# Patient Record
Sex: Female | Born: 1995 | Race: Black or African American | Hispanic: No | Marital: Single | Smoking: Never smoker
Health system: Southern US, Community
[De-identification: ages and names within clinical notes are randomized; demographics above are authoritative.]

## PROBLEM LIST (undated history)

## (undated) ENCOUNTER — Inpatient Hospital Stay (HOSPITAL_COMMUNITY): Payer: Self-pay

## (undated) DIAGNOSIS — O26619 Liver and biliary tract disorders in pregnancy, unspecified trimester: Secondary | ICD-10-CM

## (undated) DIAGNOSIS — K831 Obstruction of bile duct: Secondary | ICD-10-CM

## (undated) DIAGNOSIS — O26649 Intrahepatic cholestasis of pregnancy, unspecified trimester: Secondary | ICD-10-CM

## (undated) DIAGNOSIS — K8020 Calculus of gallbladder without cholecystitis without obstruction: Secondary | ICD-10-CM

## (undated) DIAGNOSIS — R87629 Unspecified abnormal cytological findings in specimens from vagina: Secondary | ICD-10-CM

## (undated) DIAGNOSIS — R011 Cardiac murmur, unspecified: Secondary | ICD-10-CM

## (undated) HISTORY — DX: Obstruction of bile duct: O26.619

## (undated) HISTORY — DX: Obstruction of bile duct: K83.1

## (undated) HISTORY — DX: Unspecified abnormal cytological findings in specimens from vagina: R87.629

## (undated) HISTORY — PX: INDUCED ABORTION: SHX677

## (undated) HISTORY — DX: Intrahepatic cholestasis of pregnancy, unspecified trimester: O26.649

---

## 2017-08-01 ENCOUNTER — Other Ambulatory Visit: Payer: Self-pay

## 2017-08-01 ENCOUNTER — Emergency Department (HOSPITAL_COMMUNITY)
Admission: EM | Admit: 2017-08-01 | Discharge: 2017-08-01 | Disposition: A | Discharge status: Home / Self Care | Payer: BLUE CROSS/BLUE SHIELD | Attending: Emergency Medicine | Admitting: Emergency Medicine

## 2017-08-01 ENCOUNTER — Encounter (HOSPITAL_COMMUNITY): Payer: Self-pay | Admitting: Emergency Medicine

## 2017-08-01 DIAGNOSIS — R21 Rash and other nonspecific skin eruption: Secondary | ICD-10-CM | POA: Insufficient documentation

## 2017-08-01 DIAGNOSIS — L509 Urticaria, unspecified: Secondary | ICD-10-CM | POA: Diagnosis present

## 2017-08-01 DIAGNOSIS — T7840XA Allergy, unspecified, initial encounter: Secondary | ICD-10-CM | POA: Insufficient documentation

## 2017-08-01 MED ORDER — EPINEPHRINE 0.3 MG/0.3ML IJ SOAJ: 0.3000 mg | Freq: Once | INTRAMUSCULAR | 0 refills | Status: DC | PRN

## 2017-08-01 MED ORDER — FAMOTIDINE 20 MG PO TABS: 20.0000 mg | ORAL_TABLET | Freq: Two times a day (BID) | ORAL | 0 refills | Status: DC

## 2017-08-01 MED ORDER — PREDNISONE 20 MG PO TABS
60.0000 mg | ORAL_TABLET | Freq: Once | ORAL | Status: AC
  Administered 2017-08-01: 60 mg via ORAL
  Filled 2017-08-01: qty 3

## 2017-08-01 MED ORDER — FAMOTIDINE 20 MG PO TABS
20.0000 mg | ORAL_TABLET | Freq: Once | ORAL | Status: AC
Start: 2017-08-01 — End: 2017-08-01
  Administered 2017-08-01: 20 mg via ORAL
  Filled 2017-08-01: qty 1

## 2017-08-01 MED ORDER — DIPHENHYDRAMINE HCL 25 MG PO TABS: 25.0000 mg | ORAL_TABLET | Freq: Four times a day (QID) | ORAL | 0 refills | Status: DC

## 2017-08-01 MED ORDER — PREDNISONE 20 MG PO TABS: 40.0000 mg | ORAL_TABLET | Freq: Every day | ORAL | 0 refills | Status: DC

## 2017-08-01 MED ORDER — DIPHENHYDRAMINE HCL 25 MG PO CAPS
50.0000 mg | ORAL_CAPSULE | Freq: Once | ORAL | Status: AC
  Administered 2017-08-01: 50 mg via ORAL
  Filled 2017-08-01: qty 2

## 2017-08-01 MED ORDER — HYDROCORTISONE 2.5 % EX LOTN: TOPICAL_LOTION | Freq: Two times a day (BID) | CUTANEOUS | 0 refills | Status: DC

## 2017-08-01 NOTE — Discharge Instructions (Addendum)
1. Medications: Epi Pen; Prednisone, Benadryl, Pepcid, hydrocortisone lotion, usual home medications 2. Treatment: rest, drink plenty of fluids, take medications as prescribed 3. Follow Up: Please followup with your primary doctor in 3 days for discussion of your diagnoses and further evaluation after today's visit; if you do not have a primary care doctor use the resource guide provided to find one; followup with dermatology as needed; Return to the ER for difficulty breathing, return of allergic reaction or other concerning symptoms

## 2017-08-01 NOTE — ED Triage Notes (Signed)
Patient has whelps all over body. Patient states she started itching yesterday. Patient has not started anything new but a new soap a week ago.

## 2017-08-01 NOTE — ED Provider Notes (Signed)
[facility]-EMERGENCY DEPT Provider Note   CSN: [id number] Arrival date & time: 08/01/17  0030     History   Chief Complaint Chief Complaint  Patient presents with  . Allergic Reaction    HPI Diana Higgins is a 22 y.o. female with a hx of no major medical problems presents to the Emergency Department complaining of gradual, persistent, progressively worsening hives onset sometime during the day.  Patient reports she has felt itchy all day.  She reports around 8 PM she noticed that she had a rash over most of her body.  No treatments prior to arrival.  No aggravating or alleviating factors.  Patient denies changes in detergent, new foods, change in shampoo or cosmetics.  She does report that she got a new puppy yesterday.  She does not know if the puppy was treated with flea powder.  She reports she has had a dog before without allergic reaction.  Patient also reports that she does not normally eat meat and did eat chicken last night and beef for lunch today however reports that she was already itching before she ate the beef.  She has no known tick bite history.  Patient denies associated symptoms including shortness of breath, abdominal pain, vomiting, difficulty breathing, swelling of her lips or throat.    The history is provided by the patient and medical records. No language interpreter was used.    History reviewed. No pertinent past medical history.  There are no active problems to display for this patient.   History reviewed. No pertinent surgical history.   OB History   None      Home Medications    Prior to Admission medications   Medication Sig Start Date End Date Taking? Authorizing Provider  diphenhydrAMINE (BENADRYL) 25 MG tablet Take 1 tablet (25 mg total) by mouth every 6 (six) hours. 08/01/17   Clare Casto, Dahlia ClientHannah, PA-C  EPINEPHrine (EPIPEN 2-PAK) 0.3 mg/0.3 mL IJ SOAJ injection Inject 0.3 mLs (0.3 mg total) into the muscle once as needed (for  severe allergic reaction). CAll 911 immediately if you have to use this medicine 08/01/17   Akaiya Touchette, Dahlia ClientHannah, PA-C  famotidine (PEPCID) 20 MG tablet Take 1 tablet (20 mg total) by mouth 2 (two) times daily. 08/01/17   Kimberlyn Quiocho, Dahlia ClientHannah, PA-C  hydrocortisone 2.5 % lotion Apply topically 2 (two) times daily. 08/01/17   Aaleyah Witherow, Dahlia ClientHannah, PA-C  predniSONE (DELTASONE) 20 MG tablet Take 2 tablets (40 mg total) by mouth daily. 08/01/17   Zackarey Holleman, Boyd KerbsHannah, PA-C    Family History History reviewed. No pertinent family history.  Social History Social History   Tobacco Use  . Smoking status: Never Smoker  . Smokeless tobacco: Never Used  Substance Use Topics  . Alcohol use: Never    Frequency: Never  . Drug use: Never     Allergies   Patient has no known allergies.   Review of Systems Review of Systems  Constitutional: Negative for fever.  HENT: Negative for congestion and sneezing.   Eyes: Negative for pain and itching.  Respiratory: Negative for cough, chest tightness, shortness of breath and wheezing.   Cardiovascular: Negative for chest pain.  Gastrointestinal: Negative for abdominal pain, constipation, diarrhea, nausea and vomiting.  Genitourinary: Negative for dysuria and vaginal discharge.  Musculoskeletal: Negative for joint swelling.  Skin: Positive for rash.  Allergic/Immunologic: Negative for immunocompromised state.  Neurological: Negative for syncope, light-headedness and headaches.  Hematological: Negative for adenopathy.  Psychiatric/Behavioral: The patient is not nervous/anxious.  Physical Exam Updated Vital Signs BP (!) 140/98 (BP Location: Left Arm)   Pulse 95   Temp 98.3 F (36.8 C) (Oral)   Resp 16   Ht 5\' 4"  (1.626 m)   Wt 93 kg (205 lb)   SpO2 100%   BMI 35.19 kg/m   Physical Exam  Constitutional: She is oriented to person, place, and time. She appears well-developed and well-nourished. No distress.  HENT:  Head: Normocephalic and  atraumatic.  Right Ear: Tympanic membrane, external ear and ear canal normal.  Left Ear: Tympanic membrane, external ear and ear canal normal.  Nose: Nose normal. No mucosal edema or rhinorrhea.  Mouth/Throat: Uvula is midline. No uvula swelling. No posterior oropharyngeal edema, posterior oropharyngeal erythema or tonsillar abscesses.  No swelling of the uvula or oropharynx  Eyes: Conjunctivae are normal.  Neck: Normal range of motion.  Patent airway No stridor; normal phonation Handling secretions without difficulty  Cardiovascular: Normal rate, normal heart sounds and intact distal pulses.  No murmur heard. Pulmonary/Chest: Effort normal and breath sounds normal. No stridor. No respiratory distress. She has no wheezes.  No wheezes or rhonchi  Abdominal: Soft. Bowel sounds are normal. There is no tenderness.  Musculoskeletal: Normal range of motion. She exhibits no edema.  Neurological: She is alert and oriented to person, place, and time.  Skin: Skin is warm and dry. Rash noted. She is not diaphoretic.  Urticaria noted Mild excoriations - no induration or fluctuance to indicate secondary infection  Psychiatric: She has a normal mood and affect.  Nursing note and vitals reviewed.    ED Treatments / Results  Labs (all labs ordered are listed, but only abnormal results are displayed) Labs Reviewed - No data to display  EKG None  Radiology No results found.  Procedures Procedures (including critical care time)  Medications Ordered in ED Medications  famotidine (PEPCID) tablet 20 mg (20 mg Oral Given 08/01/17 0149)  diphenhydrAMINE (BENADRYL) capsule 50 mg (50 mg Oral Given 08/01/17 0149)  predniSONE (DELTASONE) tablet 60 mg (60 mg Oral Given 08/01/17 0149)     Initial Impression / Assessment and Plan / ED Course  I have reviewed the triage vital signs and the nursing notes.  Pertinent labs & imaging results that were available during my care of the patient were reviewed by  me and considered in my medical decision making (see chart for details).     Patient presents with allergic reaction including urticaria.  No evidence of anaphylaxis.  Oral medications given here in the emergency department.  2:14 AM Patient re-evaluated prior to dc, is hemodynamically stable, in no respiratory distress, and denies the feeling of throat closing. Pt has been advised to take OTC benadryl & return to the ED if they have a mod-severe allergic rxn (s/s including throat closing, difficulty breathing, swelling of lips face or tongue). Pt is to follow up with their PCP. Pt is agreeable with plan & verbalizes understanding.   Final Clinical Impressions(s) / ED Diagnoses   Final diagnoses:  Allergic reaction, initial encounter  Urticaria    ED Discharge Orders        Ordered    diphenhydrAMINE (BENADRYL) 25 MG tablet  Every 6 hours     08/01/17 0206    famotidine (PEPCID) 20 MG tablet  2 times daily     08/01/17 0206    predniSONE (DELTASONE) 20 MG tablet  Daily     08/01/17 0206    hydrocortisone 2.5 % lotion  2 times  daily     08/01/17 0206    EPINEPHrine (EPIPEN 2-PAK) 0.3 mg/0.3 mL IJ SOAJ injection  Once PRN     08/01/17 0206       Danaysha Kirn, Boyd Kerbs 08/01/17 0214    Benjiman Core, MD 08/01/17 [phone]

## 2017-08-01 NOTE — ED Notes (Signed)
Bed: WA02 Expected date:  Expected time:  Means of arrival:  Comments: 

## 2018-01-27 ENCOUNTER — Emergency Department (HOSPITAL_COMMUNITY): Payer: BLUE CROSS/BLUE SHIELD

## 2018-01-27 ENCOUNTER — Encounter (HOSPITAL_COMMUNITY): Payer: Self-pay | Admitting: Emergency Medicine

## 2018-01-27 ENCOUNTER — Emergency Department (HOSPITAL_COMMUNITY)
Admission: EM | Admit: 2018-01-27 | Discharge: 2018-01-28 | Disposition: A | Discharge status: Home / Self Care | Payer: BLUE CROSS/BLUE SHIELD | Source: Home / Self Care | Attending: Emergency Medicine | Admitting: Emergency Medicine

## 2018-01-27 ENCOUNTER — Emergency Department (HOSPITAL_COMMUNITY)
Admission: EM | Admit: 2018-01-27 | Discharge: 2018-01-27 | Disposition: A | Discharge status: Home / Self Care | Payer: BLUE CROSS/BLUE SHIELD | Attending: Emergency Medicine | Admitting: Emergency Medicine

## 2018-01-27 DIAGNOSIS — R0789 Other chest pain: Secondary | ICD-10-CM | POA: Insufficient documentation

## 2018-01-27 DIAGNOSIS — R1011 Right upper quadrant pain: Secondary | ICD-10-CM

## 2018-01-27 DIAGNOSIS — D509 Iron deficiency anemia, unspecified: Secondary | ICD-10-CM

## 2018-01-27 DIAGNOSIS — R1013 Epigastric pain: Secondary | ICD-10-CM | POA: Insufficient documentation

## 2018-01-27 DIAGNOSIS — K8020 Calculus of gallbladder without cholecystitis without obstruction: Secondary | ICD-10-CM

## 2018-01-27 LAB — CBC
HCT: 38 % (ref 36.0–46.0)
HEMATOCRIT: 38.1 % (ref 36.0–46.0)
Hemoglobin: 10.9 g/dL — ABNORMAL LOW (ref 12.0–15.0)
Hemoglobin: 11.2 g/dL — ABNORMAL LOW (ref 12.0–15.0)
MCH: 22.4 pg — ABNORMAL LOW (ref 26.0–34.0)
MCH: 22.8 pg — ABNORMAL LOW (ref 26.0–34.0)
MCHC: 28.7 g/dL — ABNORMAL LOW (ref 30.0–36.0)
MCHC: 29.4 g/dL — ABNORMAL LOW (ref 30.0–36.0)
MCV: 78 fL — AB (ref 80.0–100.0)
MCV: 77.4 fL — ABNORMAL LOW (ref 80.0–100.0)
NRBC: 0 % (ref 0.0–0.2)
NRBC: 0 % (ref 0.0–0.2)
Platelets: 275 10*3/uL (ref 150–400)
Platelets: 285 10*3/uL (ref 150–400)
RBC: 4.87 MIL/uL (ref 3.87–5.11)
RBC: 4.92 MIL/uL (ref 3.87–5.11)
RDW: 15.6 % — AB (ref 11.5–15.5)
RDW: 15.5 % (ref 11.5–15.5)
WBC: 4.8 10*3/uL (ref 4.0–10.5)
WBC: 5.6 10*3/uL (ref 4.0–10.5)

## 2018-01-27 LAB — COMPREHENSIVE METABOLIC PANEL
ALT: 12 U/L (ref 0–44)
AST: 16 U/L (ref 15–41)
Albumin: 4.1 g/dL (ref 3.5–5.0)
Alkaline Phosphatase: 68 U/L (ref 38–126)
Anion gap: 5 (ref 5–15)
BUN: 7 mg/dL (ref 6–20)
CHLORIDE: 106 mmol/L (ref 98–111)
CO2: 28 mmol/L (ref 22–32)
Calcium: 9.6 mg/dL (ref 8.9–10.3)
Creatinine, Ser: 0.92 mg/dL (ref 0.44–1.00)
Glucose, Bld: 90 mg/dL (ref 70–99)
POTASSIUM: 4 mmol/L (ref 3.5–5.1)
Sodium: 139 mmol/L (ref 135–145)
Total Bilirubin: 0.4 mg/dL (ref 0.3–1.2)
Total Protein: 7.2 g/dL (ref 6.5–8.1)

## 2018-01-27 LAB — I-STAT TROPONIN, ED: Troponin i, poc: 0 ng/mL (ref 0.00–0.08)

## 2018-01-27 LAB — LIPASE, BLOOD
LIPASE: 27 U/L (ref 11–51)
Lipase: 36 U/L (ref 11–51)

## 2018-01-27 LAB — BASIC METABOLIC PANEL
ANION GAP: 9 (ref 5–15)
BUN: 11 mg/dL (ref 6–20)
CO2: 26 mmol/L (ref 22–32)
CREATININE: 1.08 mg/dL — AB (ref 0.44–1.00)
Calcium: 9.5 mg/dL (ref 8.9–10.3)
Chloride: 104 mmol/L (ref 98–111)
GFR calc Af Amer: >60 mL/min (ref >60)
GFR calc non Af Amer: >60 mL/min (ref >60)
GLUCOSE: 96 mg/dL (ref 70–99)
Potassium: 3.7 mmol/L (ref 3.5–5.1)
Sodium: 139 mmol/L (ref 135–145)

## 2018-01-27 LAB — HEPATIC FUNCTION PANEL
ALBUMIN: 4 g/dL (ref 3.5–5.0)
ALT: 14 U/L (ref 0–44)
AST: 20 U/L (ref 15–41)
Alkaline Phosphatase: 71 U/L (ref 38–126)
BILIRUBIN TOTAL: 0.6 mg/dL (ref 0.3–1.2)
Total Protein: 7.2 g/dL (ref 6.5–8.1)

## 2018-01-27 LAB — I-STAT BETA HCG BLOOD, ED (MC, WL, AP ONLY): I-stat hCG, quantitative: <5 m[IU]/mL (ref <5)

## 2018-01-27 MED ORDER — DICYCLOMINE HCL 20 MG PO TABS: 20.0000 mg | ORAL_TABLET | Freq: Two times a day (BID) | ORAL | 0 refills | Status: DC

## 2018-01-27 MED ORDER — SODIUM CHLORIDE 0.9 % IV BOLUS
1000.0000 mL | Freq: Once | INTRAVENOUS | Status: AC
  Administered 2018-01-27: 1000 mL via INTRAVENOUS

## 2018-01-27 MED ORDER — ONDANSETRON HCL 4 MG PO TABS: 4.0000 mg | ORAL_TABLET | Freq: Three times a day (TID) | ORAL | 0 refills | Status: DC | PRN

## 2018-01-27 MED ORDER — MORPHINE SULFATE (PF) 4 MG/ML IV SOLN
4.0000 mg | Freq: Once | INTRAVENOUS | Status: AC
  Administered 2018-01-27: 4 mg via INTRAVENOUS
  Filled 2018-01-27: qty 1

## 2018-01-27 MED ORDER — ONDANSETRON HCL 4 MG/2ML IJ SOLN
4.0000 mg | Freq: Once | INTRAMUSCULAR | Status: AC
  Administered 2018-01-27: 4 mg via INTRAVENOUS
  Filled 2018-01-27: qty 2

## 2018-01-27 MED ORDER — FAMOTIDINE IN NACL 20-0.9 MG/50ML-% IV SOLN
20.0000 mg | Freq: Once | INTRAVENOUS | Status: AC
  Administered 2018-01-27: 20 mg via INTRAVENOUS
  Filled 2018-01-27: qty 50

## 2018-01-27 NOTE — ED Triage Notes (Signed)
Pt states that she is having substernal CP or upper abd pain, seen this morning for the same, told it might be her gallbladder but did not receive and ultrasound and now requesting on and now having nausea.

## 2018-01-27 NOTE — ED Triage Notes (Addendum)
Pt reports substernal chest pain X3 hours that started as back pain.  Pt denied sob, emesis.  Reports weakness and nausea.  Pt reports she has a "functional heart murmur."

## 2018-01-27 NOTE — Discharge Instructions (Addendum)

## 2018-01-27 NOTE — ED Notes (Signed)
This RN spoke with lab, they will add on hepatic function panel and lipase to blood that is already in the lab.

## 2018-01-27 NOTE — ED Provider Notes (Signed)
[facility] EMERGENCY DEPARTMENT Provider Note   CSN: [id number] Arrival date & time: 01/27/18  4782     History   Chief Complaint Chief Complaint  Patient presents with  . Chest Pain    HPI Diana Higgins is a 22 y.o. female. Presents with constant, achy , RS cp radiates to back with occasional sharp cp.  Began about 30 min after intercourse at 2:30 am. Tried tylenol pm which seemed to work for about 30 min and then return of sx. Pain worse with movement , better at rest, hx of benign murmur . Hx of similar issues, last time it was pleural effusions. No associated sob, abdominal pain , no nausea or vomiting. No diaphoresis.Recently plane travel to [place] and did some drinking, but does not frequently drink etoh. No hx of dm, no OCP use, no hx of htn or HLD.   Nonsmoker  HPI  History reviewed. No pertinent past medical history.  There are no active problems to display for this patient.   History reviewed. No pertinent surgical history.   OB History   None      Home Medications    Prior to Admission medications   Medication Sig Start Date End Date Taking? Authorizing Provider  diphenhydrAMINE (BENADRYL) 25 MG tablet Take 1 tablet (25 mg total) by mouth every 6 (six) hours. Patient not taking: Reported on 01/27/2018 08/01/17   Muthersbaugh, Dahlia Client, PA-C  EPINEPHrine (EPIPEN 2-PAK) 0.3 mg/0.3 mL IJ SOAJ injection Inject 0.3 mLs (0.3 mg total) into the muscle once as needed (for severe allergic reaction). CAll 911 immediately if you have to use this medicine Patient not taking: Reported on 01/27/2018 08/01/17   Muthersbaugh, Dahlia Client, PA-C  famotidine (PEPCID) 20 MG tablet Take 1 tablet (20 mg total) by mouth 2 (two) times daily. Patient not taking: Reported on 01/27/2018 08/01/17   Muthersbaugh, Dahlia Client, PA-C  hydrocortisone 2.5 % lotion Apply topically 2 (two) times daily. Patient not taking: Reported on 01/27/2018 08/01/17   Muthersbaugh, Dahlia Client, PA-C  predniSONE  (DELTASONE) 20 MG tablet Take 2 tablets (40 mg total) by mouth daily. Patient not taking: Reported on 01/27/2018 08/01/17   Muthersbaugh, Dahlia Client, PA-C    Family History No family history on file.  Social History Social History   Tobacco Use  . Smoking status: Never Smoker  . Smokeless tobacco: Never Used  Substance Use Topics  . Alcohol use: Never    Frequency: Never  . Drug use: Never     Allergies   Patient has no known allergies.   Review of Systems Review of Systems Ten systems reviewed and are negative for acute change, except as noted in the HPI.    Physical Exam Updated Vital Signs BP (!) 146/92 (BP Location: Right Arm)   Pulse 80   Temp 98.4 F (36.9 C) (Oral)   Resp 20   LMP 01/20/2018   SpO2 100%   Physical Exam  Constitutional: She is oriented to person, place, and time. She appears well-developed and well-nourished. No distress.  HENT:  Head: Normocephalic and atraumatic.  Eyes: Conjunctivae are normal. No scleral icterus.  Neck: Normal range of motion.  Cardiovascular: Normal rate and regular rhythm. Exam reveals no gallop and no friction rub.  Murmur heard.  Systolic murmur is present. Pulmonary/Chest: Effort normal and breath sounds normal. No respiratory distress.  Abdominal: Soft. Bowel sounds are normal. She exhibits no distension and no mass. There is no tenderness. There is no guarding.  Neurological: She is alert  and oriented to person, place, and time.  Skin: Skin is warm and dry. She is not diaphoretic.  Psychiatric: Her behavior is normal.  Nursing note and vitals reviewed.    ED Treatments / Results  Labs (all labs ordered are listed, but only abnormal results are displayed) Labs Reviewed  BASIC METABOLIC PANEL - Abnormal; Notable for the following components:      Result Value   Creatinine, Ser 1.08 (*)    All other components within normal limits  CBC - Abnormal; Notable for the following components:   Hemoglobin 10.9 (*)     MCV 78.0 (*)    MCH 22.4 (*)    MCHC 28.7 (*)    RDW 15.6 (*)    All other components within normal limits  I-STAT TROPONIN, ED  I-STAT BETA HCG BLOOD, ED ([facility], [facility], [facility] ONLY)    EKG None  Radiology Dg Chest 2 View  Result Date: 01/27/2018 CLINICAL DATA:  22 year old female with chest pain. EXAM: CHEST - 2 VIEW COMPARISON:  None. FINDINGS: The heart size and mediastinal contours are within normal limits. Both lungs are clear. The visualized skeletal structures are unremarkable. IMPRESSION: No active cardiopulmonary disease. Electronically Signed   By: Elgie CollardArash  Radparvar M.D.   On: 01/27/2018 05:57    Procedures Procedures (including critical care time)  Medications Ordered in ED Medications - No data to display   Initial Impression / Assessment and Plan / ED Course  I have reviewed the triage vital signs and the nursing notes.  Pertinent labs & imaging results that were available during my care of the patient were reviewed by me and considered in my medical decision making (see chart for details).    22 year old female who presents with chest pain. The emergent differential diagnosis of chest pain includes: Acute coronary syndrome, pericarditis, aortic dissection, pulmonary embolism, tension pneumothorax, pneumonia, and esophageal rupture. She has a heart score of 0 and is PERC negative.  Patient does have epigastric and right upper quadrant abdominal tenderness with negative LFT elevation.  They do have a strong suspicion for biliary colic given the fact that the patient does admit to previous episodes of epigastric pain in the same region.  Patient appears appropriate for discharge at this time with outpatient follow-up.  She has health insurance and may follow-up with PCP. Discussed return precautions with the patient and strict outpatient follow-up precautions.  Final Clinical Impressions(s) / ED Diagnoses   Final diagnoses:  Atypical chest pain  Epigastric abdominal pain     ED Discharge Orders    None       Arthor CaptainHarris, Dailey Buccheri, PA-C 01/27/18 1538    Geoffery Lyonselo, Douglas, MD 01/27/18 [phone]

## 2018-01-27 NOTE — ED Notes (Signed)
C/o epigastric pain onset 230 am started in her back , c/o nausea no vomiting.

## 2018-01-28 ENCOUNTER — Emergency Department (HOSPITAL_COMMUNITY): Payer: BLUE CROSS/BLUE SHIELD

## 2018-01-28 LAB — URINALYSIS, ROUTINE W REFLEX MICROSCOPIC
Bilirubin Urine: NEGATIVE
Glucose, UA: NEGATIVE mg/dL
HGB URINE DIPSTICK: NEGATIVE
Ketones, ur: 20 mg/dL — AB
Leukocytes, UA: NEGATIVE
Nitrite: NEGATIVE
PH: 5 (ref 5.0–8.0)
Protein, ur: NEGATIVE mg/dL
SPECIFIC GRAVITY, URINE: 1.018 (ref 1.005–1.030)

## 2018-01-28 MED ORDER — ONDANSETRON 4 MG PO TBDP: ORAL_TABLET | ORAL | 0 refills | Status: DC

## 2018-01-28 MED ORDER — HYDROCODONE-ACETAMINOPHEN 5-325 MG PO TABS: 1.0000 | ORAL_TABLET | Freq: Four times a day (QID) | ORAL | 0 refills | Status: DC | PRN

## 2018-01-28 NOTE — ED Notes (Signed)
Pt informed US is very backed up, given another warm blanket

## 2018-01-28 NOTE — ED Provider Notes (Signed)
[facility]   CSN: [id number] Arrival date & time: 01/27/18  2051     History   Chief Complaint Chief Complaint  Patient presents with  . Abdominal Pain    HPI Diana PewMarie Higgins is a 22 y.o. female here presenting with right upper quadrant pain, back pain.  Patient states that she has been having right upper quadrant pain and back pain since this morning.  She also has some pain radiate to her chest as well.  She went to the ED earlier today and had normal LFTs and normal lab work.  She was thought to have either gastritis or biliary colic.  Patient came back for persistent right upper quadrant pain and nausea.  Denies any fevers at home.  Denies any history of gallstones.  The history is provided by the patient.    No past medical history on file.  There are no active problems to display for this patient.   No past surgical history on file.   OB History   None      Home Medications    Prior to Admission medications   Medication Sig Start Date End Date Taking? Authorizing Provider  dicyclomine (BENTYL) 20 MG tablet Take 1 tablet (20 mg total) by mouth 2 (two) times daily. 01/27/18   Arthor CaptainHarris, Abigail, PA-C  diphenhydrAMINE (BENADRYL) 25 MG tablet Take 1 tablet (25 mg total) by mouth every 6 (six) hours. Patient not taking: Reported on 01/27/2018 08/01/17   Muthersbaugh, Dahlia ClientHannah, PA-C  EPINEPHrine (EPIPEN 2-PAK) 0.3 mg/0.3 mL IJ SOAJ injection Inject 0.3 mLs (0.3 mg total) into the muscle once as needed (for severe allergic reaction). CAll 911 immediately if you have to use this medicine Patient not taking: Reported on 01/27/2018 08/01/17   Muthersbaugh, Dahlia ClientHannah, PA-C  famotidine (PEPCID) 20 MG tablet Take 1 tablet (20 mg total) by mouth 2 (two) times daily. Patient not taking: Reported on 01/27/2018 08/01/17   Muthersbaugh, Dahlia ClientHannah, PA-C  hydrocortisone 2.5 % lotion Apply topically 2 (two) times daily. Patient not taking: Reported on  01/27/2018 08/01/17   Muthersbaugh, Dahlia ClientHannah, PA-C  ondansetron (ZOFRAN) 4 MG tablet Take 1 tablet (4 mg total) by mouth every 8 (eight) hours as needed for nausea or vomiting. 01/27/18   Harris, Cammy CopaAbigail, PA-C  predniSONE (DELTASONE) 20 MG tablet Take 2 tablets (40 mg total) by mouth daily. Patient not taking: Reported on 01/27/2018 08/01/17   Muthersbaugh, Dahlia ClientHannah, PA-C    Family History No family history on file.  Social History Social History   Tobacco Use  . Smoking status: Never Smoker  . Smokeless tobacco: Never Used  Substance Use Topics  . Alcohol use: Never    Frequency: Never  . Drug use: Never     Allergies   Patient has no known allergies.   Review of Systems Review of Systems  Gastrointestinal: Positive for abdominal pain.  All other systems reviewed and are negative.    Physical Exam Updated Vital Signs BP (!) 145/95 (BP Location: Right Arm)   Pulse 78   Temp 100 F (37.8 C) (Oral)   Resp 18   Ht 5\' 4"  (1.626 m)   Wt 93 kg   LMP 01/20/2018   SpO2 99%   BMI 35.19 kg/m   Physical Exam  Constitutional: She is oriented to person, place, and time.  Slightly uncomfortable   HENT:  Head: Normocephalic.  Mouth/Throat: Oropharynx is clear and moist.  Eyes: Pupils are equal, round, and reactive to light.  EOM are normal.  Cardiovascular: Normal rate.  Pulmonary/Chest: Effort normal and breath sounds normal.  Abdominal:  Mild RUQ tenderness, ? Mild murphy sign. No R CVAT   Neurological: She is alert and oriented to person, place, and time.  Skin: Skin is warm. Capillary refill takes less than 2 seconds.  Psychiatric: She has a normal mood and affect. Her behavior is normal.  Nursing note and vitals reviewed.    ED Treatments / Results  Labs (all labs ordered are listed, but only abnormal results are displayed) Labs Reviewed  CBC - Abnormal; Notable for the following components:      Result Value   Hemoglobin 11.2 (*)    MCV 77.4 (*)    MCH 22.8 (*)     MCHC 29.4 (*)    All other components within normal limits  LIPASE, BLOOD  COMPREHENSIVE METABOLIC PANEL  URINALYSIS, ROUTINE W REFLEX MICROSCOPIC  I-STAT BETA HCG BLOOD, ED ([facility], [facility], [facility] ONLY)    EKG None  Radiology Dg Chest 2 View  Result Date: 01/27/2018 CLINICAL DATA:  22 year old female with chest pain. EXAM: CHEST - 2 VIEW COMPARISON:  None. FINDINGS: The heart size and mediastinal contours are within normal limits. Both lungs are clear. The visualized skeletal structures are unremarkable. IMPRESSION: No active cardiopulmonary disease. Electronically Signed   By: Elgie Collard M.D.   On: 01/27/2018 05:57    Procedures Procedures (including critical care time)  Medications Ordered in ED Medications  sodium chloride 0.9 % bolus 1,000 mL (1,000 mLs Intravenous New Bag/Given 01/27/18 2223)  ondansetron (ZOFRAN) injection 4 mg (4 mg Intravenous Given 01/27/18 2224)  famotidine (PEPCID) IVPB 20 mg premix (20 mg Intravenous New Bag/Given 01/27/18 2223)  morphine 4 MG/ML injection 4 mg (4 mg Intravenous Given 01/27/18 2224)     Initial Impression / Assessment and Plan / ED Course  I have reviewed the triage vital signs and the nursing notes.  Pertinent labs & imaging results that were available during my care of the patient were reviewed by me and considered in my medical decision making (see chart for details).    Diana Higgins is a 22 y.o. female here with RUQ pain, chest pain. Had negative troponins earlier today. Was thought to have gastritis vs biliary colic but LFTs and lipase were normal. Will repeat CBC, CMP. Will get RUQ [place].   12:29 AM Labs unremarkable. RUQ [place] pending. If US showed gallstones, likely can be followed up with surgery clinic. If it is normal, patient can be discharged. Signed out to Dr. Preston Fleeting in the ED.   Final Clinical Impressions(s) / ED Diagnoses   Final diagnoses:  RUQ pain    ED Discharge Orders    None       Charlynne Pander, MD 01/28/18  [phone]

## 2018-01-28 NOTE — Discharge Instructions (Addendum)
Take tylenol for pain   Take vicodin for severe pain   Take zofran for nausea.   Stay hydrated.   Follow up with your doctor and surgery   Return to ER if you have worse abdominal pain, chest pain, vomiting

## 2018-01-28 NOTE — ED Provider Notes (Signed)
Care assumed from Dr. Silverio Lay, pending RUQ ultrasound.  Ultrasound shows cholelithiasis.  This apparently is the cause of her pain.  She is discharged with prescription for hydrocodone-acetaminophen and ondansetron, referred to [facility] surgery for evaluation for possible elective cholecystectomy.  Advised to stand a low-fat diet.  Results for orders placed or performed during the hospital encounter of 01/27/18  Lipase, blood  Result Value Ref Range   Lipase 27 11 - 51 U/L  Comprehensive metabolic panel  Result Value Ref Range   Sodium 139 135 - 145 mmol/L   Potassium 4.0 3.5 - 5.1 mmol/L   Chloride 106 98 - 111 mmol/L   CO2 28 22 - 32 mmol/L   Glucose, Bld 90 70 - 99 mg/dL   BUN 7 6 - 20 mg/dL   Creatinine, Ser 1.61 0.44 - 1.00 mg/dL   Calcium 9.6 8.9 - 09.6 mg/dL   Total Protein 7.2 6.5 - 8.1 g/dL   Albumin 4.1 3.5 - 5.0 g/dL   AST 16 15 - 41 U/L   ALT 12 0 - 44 U/L   Alkaline Phosphatase 68 38 - 126 U/L   Total Bilirubin 0.4 0.3 - 1.2 mg/dL   GFR calc non Af Amer >60 >60 mL/min   GFR calc Af Amer >60 >60 mL/min   Anion gap 5 5 - 15  CBC  Result Value Ref Range   WBC 5.6 4.0 - 10.5 K/uL   RBC 4.92 3.87 - 5.11 MIL/uL   Hemoglobin 11.2 (L) 12.0 - 15.0 g/dL   HCT 04.5 40.9 - 81.1 %   MCV 77.4 (L) 80.0 - 100.0 fL   MCH 22.8 (L) 26.0 - 34.0 pg   MCHC 29.4 (L) 30.0 - 36.0 g/dL   RDW 91.4 78.2 - 95.6 %   Platelets 285 150 - 400 K/uL   nRBC 0.0 0.0 - 0.2 %  Urinalysis, Routine w reflex microscopic  Result Value Ref Range   Color, Urine YELLOW YELLOW   APPearance CLEAR CLEAR   Specific Gravity, Urine 1.018 1.005 - 1.030   pH 5.0 5.0 - 8.0   Glucose, UA NEGATIVE NEGATIVE mg/dL   Hgb urine dipstick NEGATIVE NEGATIVE   Bilirubin Urine NEGATIVE NEGATIVE   Ketones, ur 20 (A) NEGATIVE mg/dL   Protein, ur NEGATIVE NEGATIVE mg/dL   Nitrite NEGATIVE NEGATIVE   Leukocytes, UA NEGATIVE NEGATIVE  I-Stat beta hCG blood, ED  Result Value Ref Range   I-stat hCG, quantitative <5.0  <5 mIU/mL   Comment 3           Dg Chest 2 View  Result Date: 01/27/2018 CLINICAL DATA:  22 year old female with chest pain. EXAM: CHEST - 2 VIEW COMPARISON:  None. FINDINGS: The heart size and mediastinal contours are within normal limits. Both lungs are clear. The visualized skeletal structures are unremarkable. IMPRESSION: No active cardiopulmonary disease. Electronically Signed   By: Elgie Collard M.D.   On: 01/27/2018 05:57   US Abdomen Limited Ruq  Result Date: 01/28/2018 CLINICAL DATA:  22 year old female with right upper quadrant abdominal pain. EXAM: ULTRASOUND ABDOMEN LIMITED RIGHT UPPER QUADRANT COMPARISON:  None. FINDINGS: Gallbladder: There multiple gallstones measuring up to 3 cm. Minimal thickened appearance of the gallbladder wall measuring up to 5 mm in thickness, possibly partly related to underdistention. There is no pericholecystic fluid. Common bile duct: Diameter: 5 mm Liver: The liver is unremarkable as visualized. Portal vein is patent on color Doppler imaging with normal direction of blood flow towards the liver. IMPRESSION:  Cholelithiasis without convincing evidence of acute cholecystitis. A hepatobiliary scintigraphy may provide better evaluation of the gallbladder if there is a high clinical concern for acute cholecystitis . Electronically Signed   By: Elgie CollardArash  Radparvar M.D.   On: 01/28/2018 01:28      Dione BoozeGlick, Chaz Ronning, MD 01/28/18 [phone]

## 2018-01-28 NOTE — ED Notes (Signed)
Pt stable and ambulatory for discharge, states understanding follow up.  

## 2018-07-08 ENCOUNTER — Emergency Department (HOSPITAL_COMMUNITY): Payer: BLUE CROSS/BLUE SHIELD

## 2018-07-08 ENCOUNTER — Other Ambulatory Visit: Payer: Self-pay

## 2018-07-08 ENCOUNTER — Encounter (HOSPITAL_COMMUNITY): Payer: Self-pay | Admitting: Emergency Medicine

## 2018-07-08 ENCOUNTER — Emergency Department (HOSPITAL_COMMUNITY)
Admission: EM | Admit: 2018-07-08 | Discharge: 2018-07-08 | Disposition: A | Discharge status: Home / Self Care | Payer: BLUE CROSS/BLUE SHIELD | Attending: Emergency Medicine | Admitting: Emergency Medicine

## 2018-07-08 DIAGNOSIS — R1011 Right upper quadrant pain: Secondary | ICD-10-CM

## 2018-07-08 DIAGNOSIS — R112 Nausea with vomiting, unspecified: Secondary | ICD-10-CM | POA: Insufficient documentation

## 2018-07-08 DIAGNOSIS — K8020 Calculus of gallbladder without cholecystitis without obstruction: Secondary | ICD-10-CM | POA: Diagnosis not present

## 2018-07-08 HISTORY — DX: Calculus of gallbladder without cholecystitis without obstruction: K80.20

## 2018-07-08 LAB — CBC WITH DIFFERENTIAL/PLATELET
Abs Immature Granulocytes: 0.01 10*3/uL (ref 0.00–0.07)
Basophils Absolute: 0 10*3/uL (ref 0.0–0.1)
Basophils Relative: 0 %
Eosinophils Absolute: 0.2 10*3/uL (ref 0.0–0.5)
Eosinophils Relative: 2 %
HCT: 36.1 % (ref 36.0–46.0)
Hemoglobin: 10.9 g/dL — ABNORMAL LOW (ref 12.0–15.0)
Immature Granulocytes: 0 %
Lymphocytes Relative: 33 %
Lymphs Abs: 2.7 10*3/uL (ref 0.7–4.0)
MCH: 24.2 pg — ABNORMAL LOW (ref 26.0–34.0)
MCHC: 30.2 g/dL (ref 30.0–36.0)
MCV: 80 fL (ref 80.0–100.0)
Monocytes Absolute: 0.7 10*3/uL (ref 0.1–1.0)
Monocytes Relative: 8 %
Neutro Abs: 4.5 10*3/uL (ref 1.7–7.7)
Neutrophils Relative %: 57 %
Platelets: 242 10*3/uL (ref 150–400)
RBC: 4.51 MIL/uL (ref 3.87–5.11)
RDW: 14.9 % (ref 11.5–15.5)
WBC: 8 10*3/uL (ref 4.0–10.5)
nRBC: 0 % (ref 0.0–0.2)

## 2018-07-08 LAB — I-STAT BETA HCG BLOOD, ED (MC, WL, AP ONLY): I-stat hCG, quantitative: <5 m[IU]/mL (ref <5)

## 2018-07-08 LAB — COMPREHENSIVE METABOLIC PANEL
ALT: 23 U/L (ref 0–44)
AST: 18 U/L (ref 15–41)
Albumin: 4 g/dL (ref 3.5–5.0)
Alkaline Phosphatase: 75 U/L (ref 38–126)
Anion gap: 11 (ref 5–15)
BUN: 18 mg/dL (ref 6–20)
CO2: 23 mmol/L (ref 22–32)
Calcium: 9.4 mg/dL (ref 8.9–10.3)
Chloride: 104 mmol/L (ref 98–111)
Creatinine, Ser: 0.94 mg/dL (ref 0.44–1.00)
GFR calc Af Amer: >60 mL/min (ref >60)
GFR calc non Af Amer: >60 mL/min (ref >60)
Glucose, Bld: 100 mg/dL — ABNORMAL HIGH (ref 70–99)
Potassium: 4.3 mmol/L (ref 3.5–5.1)
Sodium: 138 mmol/L (ref 135–145)
Total Bilirubin: 0.6 mg/dL (ref 0.3–1.2)
Total Protein: 7.5 g/dL (ref 6.5–8.1)

## 2018-07-08 LAB — LIPASE, BLOOD: Lipase: 32 U/L (ref 11–51)

## 2018-07-08 MED ORDER — ONDANSETRON 4 MG PO TBDP: 4.0000 mg | ORAL_TABLET | Freq: Three times a day (TID) | ORAL | 0 refills | Status: DC | PRN

## 2018-07-08 MED ORDER — ONDANSETRON HCL 4 MG/2ML IJ SOLN
4.0000 mg | Freq: Once | INTRAMUSCULAR | Status: AC
  Administered 2018-07-08: 01:00:00 4 mg via INTRAVENOUS
  Filled 2018-07-08: qty 2

## 2018-07-08 MED ORDER — OXYCODONE-ACETAMINOPHEN 5-325 MG PO TABS: 1.0000 | ORAL_TABLET | ORAL | 0 refills | Status: DC | PRN

## 2018-07-08 MED ORDER — HYDROMORPHONE HCL 1 MG/ML IJ SOLN
1.0000 mg | Freq: Once | INTRAMUSCULAR | Status: AC
  Administered 2018-07-08: 03:00:00 1 mg via INTRAVENOUS
  Filled 2018-07-08: qty 1

## 2018-07-08 MED ORDER — MORPHINE SULFATE (PF) 4 MG/ML IV SOLN
4.0000 mg | Freq: Once | INTRAVENOUS | Status: AC
  Administered 2018-07-08: 01:00:00 4 mg via INTRAVENOUS
  Filled 2018-07-08: qty 1

## 2018-07-08 NOTE — ED Notes (Signed)
Nurse will draw  Blood with IV start

## 2018-07-08 NOTE — Discharge Instructions (Addendum)
Take the prescribed medication as directed. Try to limit fatty/greasy foods to reduce attacks. Follow-up with general surgery clinic-- call for appt. Return to the ED for new or worsening symptoms.

## 2018-07-08 NOTE — ED Provider Notes (Signed)
[facility] EMERGENCY DEPARTMENT Provider Note   CSN: [id number] Arrival date & time: 07/08/18  0005    History   Chief Complaint Chief Complaint  Patient presents with  . Chest Pain    HPI Diana Higgins is a 23 y.o. female.     The history is provided by the patient and medical records.  Chest Pain  Associated symptoms: abdominal pain, nausea and vomiting      23 year old female with history of gallstones, presenting to the ED with epigastric abdominal pain.  States this began around 7:30 PM and has been steadily worsening.  She reports multiple episodes of nonbloody, nonbilious emesis, now at the point of dry heaving.  She denies any fever or chills.  No diarrhea, bowel movements have been normal.  No prior abdominal surgeries.  She reports she has a history of gallstones that were diagnosed in December 2019 and reports this feels similar.  She has had some intermittent "attacks" since then but they have been very mild and usually resolving within an hour.  Last thing she ate was potato chips around 8 PM.  Past Medical History:  Diagnosis Date  . Gallstones     There are no active problems to display for this patient.   No past surgical history on file.   OB History   No obstetric history on file.      Home Medications    Prior to Admission medications   Medication Sig Start Date End Date Taking? Authorizing Provider  dicyclomine (BENTYL) 20 MG tablet Take 1 tablet (20 mg total) by mouth 2 (two) times daily. 01/27/18   Arthor Captain, PA-C  diphenhydrAMINE (BENADRYL) 25 MG tablet Take 1 tablet (25 mg total) by mouth every 6 (six) hours. Patient not taking: Reported on 01/27/2018 08/01/17   Muthersbaugh, Dahlia Client, PA-C  EPINEPHrine (EPIPEN 2-PAK) 0.3 mg/0.3 mL IJ SOAJ injection Inject 0.3 mLs (0.3 mg total) into the muscle once as needed (for severe allergic reaction). CAll 911 immediately if you have to use this medicine Patient not taking: Reported on  01/27/2018 08/01/17   Muthersbaugh, Dahlia Client, PA-C  famotidine (PEPCID) 20 MG tablet Take 1 tablet (20 mg total) by mouth 2 (two) times daily. Patient not taking: Reported on 01/27/2018 08/01/17   Muthersbaugh, Dahlia Client, PA-C  HYDROcodone-acetaminophen (NORCO/VICODIN) 5-325 MG tablet Take 1 tablet by mouth every 6 (six) hours as needed. 01/28/18   Charlynne Pander, MD  hydrocortisone 2.5 % lotion Apply topically 2 (two) times daily. Patient not taking: Reported on 01/27/2018 08/01/17   Muthersbaugh, Dahlia Client, PA-C  ondansetron (ZOFRAN ODT) 4 MG disintegrating tablet 4mg  ODT q4 hours prn nausea/vomit 01/28/18   Charlynne Pander, MD  ondansetron (ZOFRAN) 4 MG tablet Take 1 tablet (4 mg total) by mouth every 8 (eight) hours as needed for nausea or vomiting. 01/27/18   Harris, Cammy Copa, PA-C  predniSONE (DELTASONE) 20 MG tablet Take 2 tablets (40 mg total) by mouth daily. Patient not taking: Reported on 01/27/2018 08/01/17   Muthersbaugh, Dahlia Client, PA-C    Family History No family history on file.  Social History Social History   Tobacco Use  . Smoking status: Never Smoker  . Smokeless tobacco: Never Used  Substance Use Topics  . Alcohol use: Never    Frequency: Never  . Drug use: Never     Allergies   Patient has no known allergies.   Review of Systems Review of Systems  Gastrointestinal: Positive for abdominal pain, nausea and vomiting.  All other  systems reviewed and are negative.    Physical Exam Updated Vital Signs BP (!) 145/96 (BP Location: Right Arm) Comment: Simultaneous filing. User may not have seen previous data.  Pulse 87 Comment: Simultaneous filing. User may not have seen previous data.  Temp 98.4 F (36.9 C) (Oral)   Ht  (1.626 m)   Wt 93 kg   SpO2 100% Comment: Simultaneous filing. User may not have seen previous data.  BMI 35.19 kg/m   Physical Exam Vitals signs and nursing note reviewed.  Constitutional:      Appearance: She is well-developed.  HENT:     Head:  Normocephalic and atraumatic.  Eyes:     Conjunctiva/sclera: Conjunctivae normal.     Pupils: Pupils are equal, round, and reactive to light.  Neck:     Musculoskeletal: Normal range of motion.  Cardiovascular:     Rate and Rhythm: Normal rate and regular rhythm.     Heart sounds: Normal heart sounds.  Pulmonary:     Effort: Pulmonary effort is normal.     Breath sounds: Normal breath sounds. No wheezing or rhonchi.  Abdominal:     General: Bowel sounds are normal.     Palpations: Abdomen is soft.     Tenderness: There is abdominal tenderness in the right upper quadrant and epigastric area.  Musculoskeletal: Normal range of motion.  Skin:    General: Skin is warm and dry.  Neurological:     Mental Status: She is alert and oriented to person, place, and time.      ED Treatments / Results  Labs (all labs ordered are listed, but only abnormal results are displayed) Labs Reviewed  CBC WITH DIFFERENTIAL/PLATELET - Abnormal; Notable for the following components:      Result Value   Hemoglobin 10.9 (*)    MCH 24.2 (*)    All other components within normal limits  COMPREHENSIVE METABOLIC PANEL - Abnormal; Notable for the following components:   Glucose, Bld 100 (*)    All other components within normal limits  LIPASE, BLOOD  I-STAT BETA HCG BLOOD, ED ([facility], [facility], [facility] ONLY)    EKG None  Radiology US Abdomen Limited Ruq  Result Date: 07/08/2018 CLINICAL DATA:  Right upper quadrant pain. Known history of gallstones. EXAM: ULTRASOUND ABDOMEN LIMITED RIGHT UPPER QUADRANT COMPARISON:  Ultrasound 01/28/2018 FINDINGS: Gallbladder: Physiologically distended with intraluminal gallstones. Normal wall thickness at 2-3 mm. No pericholecystic fluid. No sonographic Murphy sign noted by sonographer. Common bile duct: Diameter: 5 mm, normal. Liver: No focal lesion identified. Within normal limits in parenchymal echogenicity. Portal vein is patent on color Doppler imaging with normal direction of  blood flow towards the liver. IMPRESSION: Cholelithiasis without sonographic findings of acute cholecystitis. Electronically Signed   By: Narda Rutherford M.D.   On: 07/08/2018 01:53    Procedures Procedures (including critical care time)  Medications Ordered in ED Medications  morphine 4 MG/ML injection 4 mg (has no administration in time range)  ondansetron (ZOFRAN) injection 4 mg (has no administration in time range)     Initial Impression / Assessment and Plan / ED Course  I have reviewed the triage vital signs and the nursing notes.  Pertinent labs & imaging results that were available during my care of the patient were reviewed by me and considered in my medical decision making (see chart for details).  23 year old female with history of gallstones, presenting to the ED for epigastric and right upper quadrant pain that began tonight around 7:30  PM.  Worse after eating potato chips at 8.  She is afebrile and nontoxic but does appear uncomfortable.  She has tenderness in the epigastrium and right upper quadrant.  Concern for possible development of cholecystitis.  Plan for screening labs and repeat right upper quadrant ultrasound.  She was given morphine and Zofran.  2:16 AM Labs and US reviewed with patient, no findings of acute cholecystitis and labs are reassuring.  Patient reports pain decreased some, still 7/10.  Additional meds ordered.  She wants to try drinking water and eating crackers so given to her.  Will reassess.  3:11 AM Patient resting comfortably, pain improved but a little drowsy.  Will need to wake up a bit before discharge.  She did hold down several packs of crackers and cup of water.  5:05 AM Patient now awake, alert.  Pain controlled, she is requesting discharge.  Will need diet modification to prevent attacks limiting fatty/greasy foods, follow- up with general surgery.  Rx percocet, zofran.  Return here for any new/acute changes.  Final Clinical  Impressions(s) / ED Diagnoses   Final diagnoses:  RUQ pain  Gallstones    ED Discharge Orders         Ordered    oxyCODONE-acetaminophen (PERCOCET) 5-325 MG tablet  Every 4 hours PRN     07/08/18 0503    ondansetron (ZOFRAN ODT) 4 MG disintegrating tablet  Every 8 hours PRN     07/08/18 0503           Garlon HatchetSanders, Ashby Leflore M, PA-C 07/08/18 0532    Gilda CreasePollina, Christopher J, MD 07/08/18 [phone]

## 2018-07-08 NOTE — ED Notes (Signed)
Pt transported to US

## 2018-07-08 NOTE — ED Notes (Signed)
Patient verbalizes understanding of discharge instructions. Opportunity for questioning and answers were provided. Armband removed by staff, pt discharged from ED by wheelchair with boyfriend outside to transport pt home

## 2018-07-08 NOTE — ED Triage Notes (Signed)
Pt coming from home with complaints of pain in middle of chest and abd that radiates to back. Having N&V and several episodes of vomiting tonight. States the pain began around 1900. Pt took Zofran at home but it did not help

## 2019-12-21 ENCOUNTER — Emergency Department (HOSPITAL_COMMUNITY)
Admission: EM | Admit: 2019-12-21 | Discharge: 2019-12-21 | Disposition: A | Discharge status: Home / Self Care | Payer: BC Managed Care – PPO | Attending: Emergency Medicine | Admitting: Emergency Medicine

## 2019-12-21 ENCOUNTER — Encounter (HOSPITAL_COMMUNITY): Payer: Self-pay

## 2019-12-21 ENCOUNTER — Emergency Department (HOSPITAL_COMMUNITY): Payer: BC Managed Care – PPO

## 2019-12-21 ENCOUNTER — Other Ambulatory Visit: Payer: Self-pay

## 2019-12-21 DIAGNOSIS — S39012A Strain of muscle, fascia and tendon of lower back, initial encounter: Secondary | ICD-10-CM

## 2019-12-21 DIAGNOSIS — S060X0A Concussion without loss of consciousness, initial encounter: Secondary | ICD-10-CM | POA: Diagnosis not present

## 2019-12-21 DIAGNOSIS — S0990XA Unspecified injury of head, initial encounter: Secondary | ICD-10-CM | POA: Diagnosis present

## 2019-12-21 MED ORDER — ACETAMINOPHEN 325 MG PO TABS
650.0000 mg | ORAL_TABLET | Freq: Once | ORAL | Status: AC
  Administered 2019-12-21: 650 mg via ORAL
  Filled 2019-12-21: qty 2

## 2019-12-21 MED ORDER — NAPROXEN 375 MG PO TABS: 375.0000 mg | ORAL_TABLET | Freq: Two times a day (BID) | ORAL | 0 refills | Status: DC

## 2019-12-21 MED ORDER — CYCLOBENZAPRINE HCL 10 MG PO TABS: 10.0000 mg | ORAL_TABLET | Freq: Two times a day (BID) | ORAL | 0 refills | Status: DC | PRN

## 2019-12-21 NOTE — Discharge Instructions (Addendum)
Take the medications as needed for pain.  Expect to be stiff and sore for the next several days.  Follow-up with your primary care doctor if the symptoms have not resolved
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
## 2020-09-19 ENCOUNTER — Other Ambulatory Visit: Payer: Self-pay

## 2020-09-19 ENCOUNTER — Emergency Department (HOSPITAL_BASED_OUTPATIENT_CLINIC_OR_DEPARTMENT_OTHER)
Admission: EM | Admit: 2020-09-19 | Discharge: 2020-09-19 | Disposition: A | Discharge status: Home / Self Care | Payer: BC Managed Care – PPO | Attending: Emergency Medicine | Admitting: Emergency Medicine

## 2020-09-19 ENCOUNTER — Encounter (HOSPITAL_BASED_OUTPATIENT_CLINIC_OR_DEPARTMENT_OTHER): Payer: Self-pay

## 2020-09-19 DIAGNOSIS — S91204A Unspecified open wound of right lesser toe(s) with damage to nail, initial encounter: Secondary | ICD-10-CM | POA: Insufficient documentation

## 2020-09-19 DIAGNOSIS — W230XXA Caught, crushed, jammed, or pinched between moving objects, initial encounter: Secondary | ICD-10-CM | POA: Diagnosis not present

## 2020-09-19 DIAGNOSIS — S91209A Unspecified open wound of unspecified toe(s) with damage to nail, initial encounter: Secondary | ICD-10-CM

## 2020-09-19 DIAGNOSIS — S99921A Unspecified injury of right foot, initial encounter: Secondary | ICD-10-CM | POA: Diagnosis present

## 2020-09-19 NOTE — ED Provider Notes (Signed)
[facility] EMERGENCY DEPT Provider Note   CSN: [id number] Arrival date & time: 09/19/20  9833     History Chief Complaint  Patient presents with   Toe Injury    Right  fourth toe    Diana Higgins is a 25 y.o. female.  The history is provided by the patient.  She hit her right fourth toe on a door jamb and partly avulsed the nail.  She denies other injury.   Past Medical History:  Diagnosis Date   Gallstones     There are no problems to display for this patient.   History reviewed. No pertinent surgical history.   OB History   No obstetric history on file.     No family history on file.  Social History   Tobacco Use   Smoking status: Never   Smokeless tobacco: Never  Vaping Use   Vaping Use: Never used  Substance Use Topics   Alcohol use: Never   Drug use: Never    Home Medications Prior to Admission medications   Medication Sig Start Date End Date Taking? Authorizing Provider  cyclobenzaprine (FLEXERIL) 10 MG tablet Take 1 tablet (10 mg total) by mouth 2 (two) times daily as needed for muscle spasms. 12/21/19   Linwood Dibbles, MD  naproxen (NAPROSYN) 375 MG tablet Take 1 tablet (375 mg total) by mouth 2 (two) times daily. 12/21/19   Linwood Dibbles, MD  dicyclomine (BENTYL) 20 MG tablet Take 1 tablet (20 mg total) by mouth 2 (two) times daily. Patient not taking: Reported on 07/08/2018 01/27/18 12/21/19  Arthor Captain, PA-C  diphenhydrAMINE (BENADRYL) 25 MG tablet Take 1 tablet (25 mg total) by mouth every 6 (six) hours. Patient not taking: Reported on 01/27/2018 08/01/17 12/21/19  Muthersbaugh, Dahlia Client, PA-C  famotidine (PEPCID) 20 MG tablet Take 1 tablet (20 mg total) by mouth 2 (two) times daily. Patient not taking: Reported on 01/27/2018 08/01/17 12/21/19  Muthersbaugh, Dahlia Client, PA-C    Allergies    Patient has no known allergies.  Review of Systems   Review of Systems  All other systems reviewed and are negative.  Physical Exam Updated Vital  Signs BP 129/75 (BP Location: Right Arm)   Pulse 67   Temp 97.9 F (36.6 C) (Oral)   Ht 5\' 4"  (1.626 m)   Wt 86.2 kg   LMP 09/11/2020   SpO2 100%   BMI 32.61 kg/m   Physical Exam Vitals and nursing note reviewed.  25 year old female, resting comfortably and in no acute distress. Vital signs are normal. Oxygen saturation is 100%, which is normal. Head is normocephalic and atraumatic. PERRLA, EOMI.  Neck is nontender and supple. Lungs are clear without rales, wheezes, or rhonchi. Chest is nontender. Heart has regular rate and rhythm without murmur. Abdomen is soft, flat. Extremities: The nail of the right fourth toe is partly avulsed and is loose.  There is no swelling, deformity of the toe.  There is no tenderness to the toe.  Remainder of extremity exam is normal. Skin is warm and dry without rash. Neurologic: Mental status is normal, cranial nerves are intact, moves all extremities equally.  ED Results / Procedures / Treatments    Procedures Procedures   Medications Ordered in ED Medications - No data to display  ED Course  I have reviewed the triage vital signs and the nursing notes.  MDM Rules/Calculators/A&P  Partial avulsion of the nail of the right fourth toe.  Patient was given the option of having the nail removed versus tape to get in place until the unstable part has been pushed out by normal nail growth.  She is opted for the latter choice.  Old records are reviewed, and she has no relevant past visits.  Final Clinical Impression(s) / ED Diagnoses Final diagnoses:  Traumatic avulsion of nail plate of toe, initial encounter    Rx / DC Orders ED Discharge Orders     None        Dione Booze, MD 09/19/20 [phone]

## 2020-09-19 NOTE — ED Triage Notes (Signed)
Patient arrives from home with c/o  pain in the fourth toe on the right foot. Patient states she hit it on the door frame about 1.5 hours ago

## 2020-09-19 NOTE — Discharge Instructions (Addendum)
Use tape to keep the nail in place.  At any time, if it is bothering you, you may return to have the nail removed.
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
## 2021-09-03 LAB — HEPATITIS C ANTIBODY: HCV Ab: NEGATIVE

## 2021-09-03 LAB — OB RESULTS CONSOLE RPR: RPR: NONREACTIVE

## 2021-09-03 LAB — OB RESULTS CONSOLE HEPATITIS B SURFACE ANTIGEN: Hepatitis B Surface Ag: NEGATIVE

## 2021-09-03 LAB — OB RESULTS CONSOLE ABO/RH: RH Type: POSITIVE

## 2021-09-03 LAB — OB RESULTS CONSOLE GC/CHLAMYDIA
Chlamydia: NEGATIVE
Neisseria Gonorrhea: NEGATIVE

## 2021-09-03 LAB — OB RESULTS CONSOLE ANTIBODY SCREEN: Antibody Screen: NEGATIVE

## 2021-09-03 LAB — OB RESULTS CONSOLE RUBELLA ANTIBODY, IGM: Rubella: IMMUNE

## 2021-09-03 LAB — OB RESULTS CONSOLE HIV ANTIBODY (ROUTINE TESTING): HIV: NONREACTIVE

## 2021-12-31 ENCOUNTER — Encounter (HOSPITAL_COMMUNITY): Payer: Self-pay | Admitting: Obstetrics and Gynecology

## 2021-12-31 ENCOUNTER — Inpatient Hospital Stay (HOSPITAL_COMMUNITY)
Admission: AD | Admit: 2021-12-31 | Discharge: 2022-01-01 | Disposition: A | Discharge status: Home / Self Care | Payer: Medicaid Other | Attending: Obstetrics and Gynecology | Admitting: Obstetrics and Gynecology

## 2021-12-31 DIAGNOSIS — R000 Tachycardia, unspecified: Secondary | ICD-10-CM | POA: Diagnosis not present

## 2021-12-31 DIAGNOSIS — O4703 False labor before 37 completed weeks of gestation, third trimester: Secondary | ICD-10-CM

## 2021-12-31 DIAGNOSIS — R002 Palpitations: Secondary | ICD-10-CM

## 2021-12-31 DIAGNOSIS — O10912 Unspecified pre-existing hypertension complicating pregnancy, second trimester: Secondary | ICD-10-CM | POA: Insufficient documentation

## 2021-12-31 DIAGNOSIS — Z3A27 27 weeks gestation of pregnancy: Secondary | ICD-10-CM

## 2021-12-31 HISTORY — DX: Cardiac murmur, unspecified: R01.1

## 2021-12-31 LAB — URINALYSIS, ROUTINE W REFLEX MICROSCOPIC
Bacteria, UA: NONE SEEN
Bilirubin Urine: NEGATIVE
Glucose, UA: NEGATIVE mg/dL
Hgb urine dipstick: NEGATIVE
Ketones, ur: NEGATIVE mg/dL
Leukocytes,Ua: NEGATIVE
Nitrite: NEGATIVE
Protein, ur: NEGATIVE mg/dL
Specific Gravity, Urine: 1.005 (ref 1.005–1.030)
pH: 6 (ref 5.0–8.0)

## 2021-12-31 MED ORDER — LACTATED RINGERS IV BOLUS
1000.0000 mL | Freq: Once | INTRAVENOUS | Status: AC
  Administered 2021-12-31: 1000 mL via INTRAVENOUS

## 2021-12-31 NOTE — MAU Provider Note (Signed)
History     CSN: [id number]  Arrival date and time: 12/31/21 2115   Event Date/Time   First Provider Initiated Contact with Patient 12/31/21 2231      Chief Complaint  Patient presents with   Hypertension   Tachycardia   HPI  Diana Higgins is a 26 y.o. G2P0010 at [redacted]w[redacted]d who presents for evaluation of a racing heartbeat. Patient reports she was in class, coughed and felt like her heart was racing. She reports she checked it with her home cuff and it was in the 170s-180s for about 40 minutes. She reports her chest felt tight at that time. Since her arrival to MAU, this has resolved. She states this has happened a few times in the past but resolves within the first few minutes. She reports a hx of heart murmur in childhood but no other cardiac history. She denies any abdominal pain.  She denies any vaginal bleeding, discharge, and leaking of fluid. Denies any constipation, diarrhea or any urinary complaints. Reports normal fetal movement.   OB History     Gravida  2   Para      Term      Preterm      AB  1   Living         SAB      IAB      Ectopic      Multiple      Live Births              Past Medical History:  Diagnosis Date   Gallstones    Heart murmur    since birth per pt    History reviewed. No pertinent surgical history.  No family history on file.  Social History   Tobacco Use   Smoking status: Never   Smokeless tobacco: Never  Vaping Use   Vaping Use: Never used  Substance Use Topics   Alcohol use: Never   Drug use: Never    Allergies: No Known Allergies  Medications Prior to Admission  Medication Sig Dispense Refill Last Dose   Prenatal Vit-Fe Fumarate-FA (PRENATAL MULTIVITAMIN) TABS tablet Take 1 tablet by mouth daily at 12 noon.   12/31/2021   ursodiol (ACTIGALL) 300 MG capsule Take 300 mg by mouth 2 (two) times daily.   12/31/2021 at 0830   cyclobenzaprine (FLEXERIL) 10 MG tablet Take 1 tablet (10 mg total) by mouth 2 (two)  times daily as needed for muscle spasms. 20 tablet 0    naproxen (NAPROSYN) 375 MG tablet Take 1 tablet (375 mg total) by mouth 2 (two) times daily. 20 tablet 0     Review of Systems  Constitutional: Negative.  Negative for fatigue and fever.  HENT: Negative.    Respiratory: Negative.  Negative for shortness of breath.   Cardiovascular:  Positive for palpitations. Negative for chest pain.  Gastrointestinal: Negative.  Negative for abdominal pain, constipation, diarrhea, nausea and vomiting.  Genitourinary: Negative.  Negative for dysuria, vaginal bleeding and vaginal discharge.  Neurological: Negative.  Negative for dizziness and headaches.   Physical Exam   Blood pressure 134/83, pulse (!) 105, temperature 97.9 F (36.6 C), resp. rate 18, height 5\' 4"  (1.626 m), weight 111.6 kg, SpO2 97 %.  Patient Vitals for the past 24 hrs:  BP Temp Pulse Resp SpO2 Height Weight  12/31/21 2219 134/83 -- (!) 105 -- 97 % -- --  12/31/21 2145 129/85 -- -- -- -- -- --  12/31/21 2141 -- -- (!) 116 --  99 % -- --  12/31/21 2135 -- 97.9 F (36.6 C) -- 18 98 % 5\' 4"  (1.626 m) 111.6 kg    Physical Exam Vitals and nursing note reviewed.  Constitutional:      General: She is not in acute distress.    Appearance: She is well-developed.  HENT:     Head: Normocephalic.  Eyes:     Pupils: Pupils are equal, round, and reactive to light.  Cardiovascular:     Rate and Rhythm: Normal rate and regular rhythm.     Heart sounds: Normal heart sounds.  Pulmonary:     Effort: Pulmonary effort is normal. No respiratory distress.     Breath sounds: Normal breath sounds.  Abdominal:     General: Bowel sounds are normal. There is no distension.     Palpations: Abdomen is soft.     Tenderness: There is no abdominal tenderness.  Skin:    General: Skin is warm and dry.  Neurological:     Mental Status: She is alert and oriented to person, place, and time.  Psychiatric:        Mood and Affect: Mood normal.         Behavior: Behavior normal.        Thought Content: Thought content normal.        Judgment: Judgment normal.     Fetal Tracing:  Baseline: 130  Variability: moderate Accels: 15x15 Decels: none  Toco: 4-7 with UI  Dilation: Closed Effacement (%): Thick Exam by:: Len Blalock, CNM   MAU Course  Procedures  Results for orders placed or performed during the hospital encounter of 12/31/21 (from the past 24 hour(s))  Urinalysis, Routine w reflex microscopic     Status: Abnormal   Collection Time: 12/31/21  9:32 PM  Result Value Ref Range   Color, Urine STRAW (A) YELLOW   APPearance CLEAR CLEAR   Specific Gravity, Urine 1.005 1.005 - 1.030   pH 6.0 5.0 - 8.0   Glucose, UA NEGATIVE NEGATIVE mg/dL   Hgb urine dipstick NEGATIVE NEGATIVE   Bilirubin Urine NEGATIVE NEGATIVE   Ketones, ur NEGATIVE NEGATIVE mg/dL   Protein, ur NEGATIVE NEGATIVE mg/dL   Nitrite NEGATIVE NEGATIVE   Leukocytes,Ua NEGATIVE NEGATIVE   RBC / HPF 0-5 0 - 5 RBC/hpf   WBC, UA 0-5 0 - 5 WBC/hpf   Bacteria, UA NONE SEEN NONE SEEN   Squamous Epithelial / LPF 0-5 0 - 5     MDM Prenatal records from community office not on file. Pregnancy complicated by ICP per patient report Labs ordered and reviewed.   UA LR bolus for UC's  Contraction still present but patient not feeling them. Discussed procardia and patient declines stating she doesn't want to take medicine unless she has to. Preterm labor precautions reviewed at length  Discussed referral to [facility] cardiology for further evaluation and discussed returning to MAU if fast heart beat returns so we can evaluate in real time.   Assessment and Plan   1. Preterm uterine contractions in third trimester, antepartum   2. [redacted] weeks gestation of pregnancy   3. Palpitations     -Discharge home in stable condition -Preterm labor precautions discussed -Patient advised to follow-up with OB as scheduled for prenatal care. Referral for [facility] cardiology  placed -Patient may return to MAU as needed or if her condition were to change or worsen  Wende Mott, CNM 12/31/2021, 10:31 PM

## 2021-12-31 NOTE — Discharge Instructions (Signed)

## 2021-12-31 NOTE — MAU Note (Signed)
.  Diana Higgins is a 26 y.o. at [redacted]w[redacted]d here in MAU reporting was sitting today in class and coughed. Afterward was aware of heart beating and could feel it beating in her chest. States her pulse at the time was 170s. No hx cardiac issues except murmur as infant.  Took her b/p at home later this evening and was 120-140 over 90s-106. Very mild cramping that is not really painful. Denies LOF or VB. Reports good FM. Recently dx with ICP  Onset of complaint: today Pain score: 0 Vitals:   12/31/21 2135  Resp: 18  Temp: 97.9 F (36.6 C)  SpO2: 98%     FHT:140 Lab orders placed from triage:

## 2022-01-02 ENCOUNTER — Ambulatory Visit: Payer: Medicaid Other | Attending: Cardiology | Admitting: Cardiology

## 2022-01-02 ENCOUNTER — Encounter: Payer: Self-pay | Admitting: Cardiology

## 2022-01-02 VITALS — BP 130/64 | HR 90 | Ht 64.0 in | Wt 247.4 lb

## 2022-01-02 DIAGNOSIS — R011 Cardiac murmur, unspecified: Secondary | ICD-10-CM | POA: Diagnosis not present

## 2022-01-02 DIAGNOSIS — R002 Palpitations: Secondary | ICD-10-CM | POA: Diagnosis not present

## 2022-01-02 NOTE — Patient Instructions (Addendum)
Medication Instructions:  Your physician recommends that you continue on your current medications as directed. Please refer to the Current Medication list given to you today.  *If you need a refill on your cardiac medications before your next appointment, please call your pharmacy*   Lab Work: None ordered   If you have labs (blood work) drawn today and your tests are completely normal, you will receive your results only by: MyChart Message (if you have MyChart) OR A paper copy in the mail If you have any lab test that is abnormal or we need to change your treatment, we will call you to review the results.   Testing/Procedures: A zio monitor was ordered today. It will remain on for 7 days. You will then return monitor and event diary in provided box. It takes 1-2 weeks for report to be downloaded and returned to [place]. We will call you with the results. If monitor falls off or has orange flashing light, please call [employer] for further instructions.    Your physician has requested that you have an echocardiogram. Echocardiography is a painless test that uses sound waves to create images of your heart. It provides your doctor with information about the size and shape of your heart and how well your heart's chambers and valves are working. This procedure takes approximately one hour. There are no restrictions for this procedure. Please do NOT wear cologne, perfume, aftershave, or lotions (deodorant is allowed). Please arrive 15 minutes prior to your appointment time.  Follow-Up: At [facility], you and your health needs are our priority.  As part of our continuing mission to provide you with exceptional heart care, we have created designated Provider Care Teams.  These Care Teams include your primary Cardiologist (physician) and Advanced Practice Providers (APPs -  Physician Assistants and Nurse Practitioners) who all work together to provide you with the care you need, when you need it.  We  recommend signing up for the patient portal called "MyChart".  Sign up information is provided on this After Visit Summary.  MyChart is used to connect with patients for Virtual Visits (Telemedicine).  Patients are able to view lab/test results, encounter notes, upcoming appointments, etc.  Non-urgent messages can be sent to your provider as well.   To learn more about what you can do with MyChart, go to [email or web address].    Your next appointment:   10 week(s)  The format for your next appointment:   Virtual Visit   Provider:   Dr. Thomasene Ripple    Other Instructions ZIO XT- Long Term Monitor Instructions  Your physician has requested you wear a ZIO patch monitor for 14 days.  This is a single patch monitor. [employer] supplies one patch monitor per enrollment. Additional stickers are not available. Please do not apply patch if you will be having a Nuclear Stress Test,  Echocardiogram, Cardiac CT, MRI, or Chest Xray during the period you would be wearing the  monitor. The patch cannot be worn during these tests. You cannot remove and re-apply the  ZIO XT patch monitor.  Your ZIO patch monitor will be mailed 3 day USPS to your address on file. It may take 3-5 days  to receive your monitor after you have been enrolled.  Once you have received your monitor, please review the enclosed instructions. Your monitor  has already been registered assigning a specific monitor serial # to you.  Billing and Patient Assistance Program Information  We have supplied [employer] with any of  your insurance information on file for billing purposes. [employer] offers a sliding scale Patient Assistance Program for patients that do not have  insurance, or whose insurance does not completely cover the cost of the ZIO monitor.  You must apply for the Patient Assistance Program to qualify for this discounted rate.  To apply, please call [employer] at [phone], select option 4, select option 2, ask to apply for   Patient Assistance Program. Meredeth Ide will ask your household income, and how many people  are in your household. They will quote your out-of-pocket cost based on that information.  [employer] will also be able to set up a 74-month, interest-free payment plan if needed.  Applying the monitor   Shave hair from upper left chest.  Hold abrader disc by orange tab. Rub abrader in 40 strokes over the upper left chest as  indicated in your monitor instructions.  Clean area with 4 enclosed alcohol pads. Let dry.  Apply patch as indicated in monitor instructions. Patch will be placed under collarbone on left  side of chest with arrow pointing upward.  Rub patch adhesive wings for 2 minutes. Remove white label marked "1". Remove the white  label marked "2". Rub patch adhesive wings for 2 additional minutes.  While looking in a mirror, press and release button in center of patch. A small green light will  flash 3-4 times. This will be your only indicator that the monitor has been turned on.  Do not shower for the first 24 hours. You may shower after the first 24 hours.  Press the button if you feel a symptom. You will hear a small click. Record Date, Time and  Symptom in the Patient Logbook.  When you are ready to remove the patch, follow instructions on the last 2 pages of Patient  Logbook. Stick patch monitor onto the last page of Patient Logbook.  Place Patient Logbook in the blue and white box. Use locking tab on box and tape box closed  securely. The blue and white box has prepaid postage on it. Please place it in the mailbox as  soon as possible. Your physician should have your test results approximately 7 days after the  monitor has been mailed back to [facility].  Call [facility] Customer Care at [phone] if you have questions regarding  your ZIO XT patch monitor. Call them immediately if you see an orange light blinking on your  monitor.  If your monitor falls off in less than 4  days, contact our Monitor department at [phone].  If your monitor becomes loose or falls off after 4 days call [employer] at [phone] for  suggestions on securing your monitor

## 2022-01-02 NOTE — Progress Notes (Signed)
Card[facility]st[facility]s [facility]  New Evaluation  Date:  01/02/2022   ID:  Diana Higgins, DOB 1995-10-21, MRN [id number]  PCP:  Patient, No Pcp Per   [facility] Providers Cardiologist:  None  Electrophysiologist:  None       Referring MD: Rolm Bookbinder, CNM   Chief Complaint: Heart is racing   History of Present Illness:    Diana Higgins is a 26 y.o. 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 She reports that her heart was racing while sitting in class, coughed once and felt her heat beat fast. This lasted approximately 60 min - 1.5 hours.  Pulse was checked multiple times and was between 175-190 bpm prior to arrival to the MAU. The patient also reported that she had a few elevated pressures with a maximum of 140s/100s. However, BP in the MAU was normotensive. She was given a bolus in the MAU and was sent home with a follow up here.  She does note that the symptoms may have happened in the past but that she does not remember them ever being as significant as they were on 12/31/21.   She has noticed her heart beat quickly but this subsides.   Had functional heart murmur since birth. Patient has had no interventions for this.   Patient reports that she has not had dyspnea, excessive BLE swelling, or chest pain.   Seen at the request of Rolm Bookbinder, CNM.   Prior CV Studies Reviewed: The following studies were reviewed today: EKG   Past Medical History:  Diagnosis Date   Gallstones    Heart murmur    since birth per pt    No past surgical history on file.    OB History     Gravida  2   Para      Term      Preterm      AB  1   Living         SAB      IAB      Ectopic      Multiple      Live Births                  Current Medications: Current Meds  Medication Sig   Prenatal Vit-Fe Fumarate-FA (PRENATAL MULTIVITAMIN) TABS tablet Take 1  tablet by mouth daily at 12 noon.   ursodiol (ACTIGALL) 300 MG capsule Take 300 mg by mouth 2 (two) times daily.     Allergies:   Patient has no known allergies.   Social History   Socioeconomic History   Marital status: Single    Spouse name: Not on file   Number of children: Not on file   Years of education: Not on file   Highest education level: Not on file  Occupational History   Not on file  Tobacco Use   Smoking status: Never   Smokeless tobacco: Never  Vaping Use   Vaping Use: Never used  Substance and Sexual Activity   Alcohol use: Never   Drug use: Never   Sexual activity: Yes  Other Topics Concern   Not on file  Social History Narrative   Not on file   Social Determinants of Health   Financial Resource Strain: Not on file  Food Insecurity: Not on file  Transportation Needs: Not on file  Physical Activity: Not on  file  Stress: Not on file  Social Connections: Not on file      No family history on file.    ROS:   Please see the history of present illness.    Denies dyspnea, heart palpitation, BLE swelling All other systems reviewed and are negative.   Labs/EKG Reviewed:    EKG:   EKG is ordered today.  The ekg ordered today demonstrates NSR with sinus arrhythmia and vent rate of 90, qtc 411  Recent Labs: No results found for requested labs within last 365 days.   Recent Lipid Panel No results found for: "CHOL", "TRIG", "HDL", "CHOLHDL", "LDLCALC", "LDLDIRECT"  Physical Exam:    VS:  BP 130/64   Pulse 90   Ht 5\' 4"  (1.626 m)   Wt 247 lb 6.4 oz (112.2 kg)   SpO2 99%   BMI 42.47 kg/m     Wt Readings from Last 3 Encounters:  01/02/22 247 lb 6.4 oz (112.2 kg)  12/31/21 246 lb (111.6 kg)  09/19/20 190 lb (86.2 kg)     GEN: Well nourished, well developed in no acute distress HEENT: Normal NECK: No JVD; No carotid bruits LYMPHATICS: No lymphadenopathy CARDIAC: mid systolic murmur over right sternal border, no rubs or  gallops RESPIRATORY:  Clear to auscultation without rales, wheezing or rhonchi  ABDOMEN: Soft, non-tender, non-distended MUSCULOSKELETAL:  1+ pitting edema to mid calf; No deformity  SKIN: Warm and dry NEUROLOGIC:  Alert and oriented x 3 PSYCHIATRIC:  Normal affect    Risk Assessment/Risk Calculators:     CARPREG II Risk Prediction Index Score:  1.  The patient's risk for a primary cardiac event is 5%.            ASSESSMENT & PLAN:    Tachycardia  Functional murmur: Self limited tachycardia with EKG NSR with sinus arrhythmia in office today. Given the history provided and her PMHx of functional heart murmur, would like further monitoring for arrhythmia via Zio patch x7 days. She is to mail this back for assessment. Given medical history of functional murmur with murmur present on examination, will obtain a baseline Echo to assess for congenital abnormalities structurally in the heart.   Elevated BP: Patient reports that she had several elevated pressures at home prior to the onset of tachycardia, normotensive with no red flag symptoms today. Will have her closely monitor her pressures with her cuff at home.    Patient Instructions  Medication Instructions:  Your physician recommends that you continue on your current medications as directed. Please refer to the Current Medication list given to you today.  *If you need a refill on your cardiac medications before your next appointment, please call your pharmacy*   Lab Work: None ordered   If you have labs (blood work) drawn today and your tests are completely normal, you will receive your results only by: MyChart Message (if you have MyChart) OR A paper copy in the mail If you have any lab test that is abnormal or we need to change your treatment, we will call you to review the results.   Testing/Procedures: A zio monitor was ordered today. It will remain on for 7 days. You will then return monitor and event diary in provided  box. It takes 1-2 weeks for report to be downloaded and returned to 09/21/20. We will call you with the results. If monitor falls off or has orange flashing light, please call Zio for further instructions.    Your physician has requested that  you have an echocardiogram. Echocardiography is a painless test that uses sound waves to create images of your heart. It provides your doctor with information about the size and shape of your heart and how well your heart's chambers and valves are working. This procedure takes approximately one hour. There are no restrictions for this procedure. Please do NOT wear cologne, perfume, aftershave, or lotions (deodorant is allowed). Please arrive 15 minutes prior to your appointment time.  Follow-Up: At [facility], you and your health needs are our priority.  As part of our continuing mission to provide you with exceptional heart care, we have created designated Provider Care Teams.  These Care Teams include your primary Cardiologist (physician) and Advanced Practice Providers (APPs -  Physician Assistants and Nurse Practitioners) who all work together to provide you with the care you need, when you need it.  We recommend signing up for the patient portal called "MyChart".  Sign up information is provided on this After Visit Summary.  MyChart is used to connect with patients for Virtual Visits (Telemedicine).  Patients are able to view lab/test results, encounter notes, upcoming appointments, etc.  Non-urgent messages can be sent to your provider as well.   To learn more about what you can do with MyChart, go to [email or web address].    Your next appointment:   10 week(s)  The format for your next appointment:   Virtual Visit   Provider:   Dr. Thomasene Ripple    Other Instructions ZIO XT- Long Term Monitor Instructions  Your physician has requested you wear a ZIO patch monitor for 14 days.  This is a single patch monitor. [employer] supplies one patch  monitor per enrollment. Additional stickers are not available. Please do not apply patch if you will be having a Nuclear Stress Test,  Echocardiogram, Cardiac CT, MRI, or Chest Xray during the period you would be wearing the  monitor. The patch cannot be worn during these tests. You cannot remove and re-apply the  ZIO XT patch monitor.  Your ZIO patch monitor will be mailed 3 day USPS to your address on file. It may take 3-5 days  to receive your monitor after you have been enrolled.  Once you have received your monitor, please review the enclosed instructions. Your monitor  has already been registered assigning a specific monitor serial # to you.  Billing and Patient Assistance Program Information  We have supplied [employer] with any of your insurance information on file for billing purposes. [employer] offers a sliding scale Patient Assistance Program for patients that do not have  insurance, or whose insurance does not completely cover the cost of the ZIO monitor.  You must apply for the Patient Assistance Program to qualify for this discounted rate.  To apply, please call [employer] at [phone], select option 4, select option 2, ask to apply for  Patient Assistance Program. Meredeth Ide will ask your household income, and how many people  are in your household. They will quote your out-of-pocket cost based on that information.  [employer] will also be able to set up a 64-month, interest-free payment plan if needed.  Applying the monitor   Shave hair from upper left chest.  Hold abrader disc by orange tab. Rub abrader in 40 strokes over the upper left chest as  indicated in your monitor instructions.  Clean area with 4 enclosed alcohol pads. Let dry.  Apply patch as indicated in monitor instructions. Patch will be placed under collarbone on left  side of  chest with arrow pointing upward.  Rub patch adhesive wings for 2 minutes. Remove white label marked "1". Remove the white  label marked "2".  Rub patch adhesive wings for 2 additional minutes.  While looking in a mirror, press and release button in center of patch. A small green light will  flash 3-4 times. This will be your only indicator that the monitor has been turned on.  Do not shower for the first 24 hours. You may shower after the first 24 hours.  Press the button if you feel a symptom. You will hear a small click. Record Date, Time and  Symptom in the Patient Logbook.  When you are ready to remove the patch, follow instructions on the last 2 pages of Patient  Logbook. Stick patch monitor onto the last page of Patient Logbook.  Place Patient Logbook in the blue and white box. Use locking tab on box and tape box closed  securely. The blue and white box has prepaid postage on it. Please place it in the mailbox as  soon as possible. Your physician should have your test results approximately 7 days after the  monitor has been mailed back to [facility].  Call [facility] Customer Care at [phone] if you have questions regarding  your ZIO XT patch monitor. Call them immediately if you see an orange light blinking on your  monitor.  If your monitor falls off in less than 4 days, contact our Monitor department at [phone].  If your monitor becomes loose or falls off after 4 days call [employer] at [phone] for  suggestions on securing your monitor         Medication Adjustments/Labs and Tests Ordered: Current medicines are reviewed at length with the patient today.  Concerns regarding medicines are outlined above.  Tests Ordered: Orders Placed This Encounter  Procedures   LONG TERM MONITOR (3-14 DAYS)   ECHOCARDIOGRAM COMPLETE   Medication Changes: No orders of the defined types were placed in this encounter.

## 2022-01-06 ENCOUNTER — Ambulatory Visit: Payer: Medicaid Other | Attending: Cardiology

## 2022-01-06 DIAGNOSIS — R002 Palpitations: Secondary | ICD-10-CM

## 2022-01-06 NOTE — Progress Notes (Unsigned)
Enrolled for [employer] to mail a ZIO XT long term holter monitor to the patients address on file.  

## 2022-01-09 DIAGNOSIS — R002 Palpitations: Secondary | ICD-10-CM | POA: Diagnosis not present

## 2022-01-23 ENCOUNTER — Other Ambulatory Visit (HOSPITAL_COMMUNITY): Payer: Medicaid Other

## 2022-02-01 ENCOUNTER — Inpatient Hospital Stay (HOSPITAL_COMMUNITY): Payer: Medicaid Other

## 2022-02-01 ENCOUNTER — Encounter (HOSPITAL_COMMUNITY): Payer: Self-pay | Admitting: Obstetrics & Gynecology

## 2022-02-01 ENCOUNTER — Inpatient Hospital Stay (HOSPITAL_COMMUNITY)
Admission: AD | Admit: 2022-02-01 | Discharge: 2022-02-01 | Disposition: A | Discharge status: Home / Self Care | Payer: Medicaid Other | Attending: Obstetrics & Gynecology | Admitting: Obstetrics & Gynecology

## 2022-02-01 DIAGNOSIS — O26613 Liver and biliary tract disorders in pregnancy, third trimester: Secondary | ICD-10-CM | POA: Diagnosis present

## 2022-02-01 DIAGNOSIS — Z3A31 31 weeks gestation of pregnancy: Secondary | ICD-10-CM | POA: Insufficient documentation

## 2022-02-01 DIAGNOSIS — O99613 Diseases of the digestive system complicating pregnancy, third trimester: Secondary | ICD-10-CM | POA: Insufficient documentation

## 2022-02-01 DIAGNOSIS — K8020 Calculus of gallbladder without cholecystitis without obstruction: Secondary | ICD-10-CM | POA: Diagnosis not present

## 2022-02-01 LAB — CBC WITH DIFFERENTIAL/PLATELET
Abs Immature Granulocytes: 0.02 10*3/uL (ref 0.00–0.07)
Basophils Absolute: 0 10*3/uL (ref 0.0–0.1)
Basophils Relative: 0 %
Eosinophils Absolute: 0.1 10*3/uL (ref 0.0–0.5)
Eosinophils Relative: 2 %
HCT: 34.5 % — ABNORMAL LOW (ref 36.0–46.0)
Hemoglobin: 11.3 g/dL — ABNORMAL LOW (ref 12.0–15.0)
Immature Granulocytes: 0 %
Lymphocytes Relative: 28 %
Lymphs Abs: 1.7 10*3/uL (ref 0.7–4.0)
MCH: 27.6 pg (ref 26.0–34.0)
MCHC: 32.8 g/dL (ref 30.0–36.0)
MCV: 84.1 fL (ref 80.0–100.0)
Monocytes Absolute: 0.6 10*3/uL (ref 0.1–1.0)
Monocytes Relative: 9 %
Neutro Abs: 3.6 10*3/uL (ref 1.7–7.7)
Neutrophils Relative %: 61 %
Platelets: 188 10*3/uL (ref 150–400)
RBC: 4.1 MIL/uL (ref 3.87–5.11)
RDW: 12.4 % (ref 11.5–15.5)
WBC: 5.9 10*3/uL (ref 4.0–10.5)
nRBC: 0 % (ref 0.0–0.2)

## 2022-02-01 LAB — COMPREHENSIVE METABOLIC PANEL
ALT: 84 U/L — ABNORMAL HIGH (ref 0–44)
AST: 59 U/L — ABNORMAL HIGH (ref 15–41)
Albumin: 2.9 g/dL — ABNORMAL LOW (ref 3.5–5.0)
Alkaline Phosphatase: 217 U/L — ABNORMAL HIGH (ref 38–126)
Anion gap: 7 (ref 5–15)
BUN: 8 mg/dL (ref 6–20)
CO2: 24 mmol/L (ref 22–32)
Calcium: 8.7 mg/dL — ABNORMAL LOW (ref 8.9–10.3)
Chloride: 106 mmol/L (ref 98–111)
Creatinine, Ser: 0.74 mg/dL (ref 0.44–1.00)
GFR, Estimated: >60 mL/min (ref >60)
Glucose, Bld: 86 mg/dL (ref 70–99)
Potassium: 3.8 mmol/L (ref 3.5–5.1)
Sodium: 137 mmol/L (ref 135–145)
Total Bilirubin: 0.7 mg/dL (ref 0.3–1.2)
Total Protein: 6.6 g/dL (ref 6.5–8.1)

## 2022-02-01 NOTE — MAU Provider Note (Signed)
History     CSN: [id number]  Arrival date and time: 02/01/22 1940   Event Date/Time   First Provider Initiated Contact with Patient 02/01/22 2040      Chief Complaint  Patient presents with   Abdominal Pain   HPI  [name] is a 26 y.o. female G2P0010 @ [redacted]w[redacted]d here in MAU with complaints of RUQ pain yesterday. 3-4 years ago she was diagnosed with gall stones. It was reccommended that she have her gall bladder out but she did not want to do that at the time. She reports RUQ pain yesterday. She ate pizza rolls like 30+ pizza rolls for breakfast yesterday. The pain started shortly after that. She vomited 1x, however the pain continued all day.   Currently she has no pain. No know fever. No vomiting today. Today she ate applesauce.   OB History     Gravida  2   Para      Term      Preterm      AB  1   Living         SAB      IAB      Ectopic      Multiple      Live Births              Past Medical History:  Diagnosis Date   Gallstones    Heart murmur    since birth per pt    No past surgical history on file.  No family history on file.  Social History   Tobacco Use   Smoking status: Never   Smokeless tobacco: Never  Vaping Use   Vaping Use: Never used  Substance Use Topics   Alcohol use: Never   Drug use: Never    Allergies: No Known Allergies  Medications Prior to Admission  Medication Sig Dispense Refill Last Dose   Prenatal Vit-Fe Fumarate-FA (PRENATAL MULTIVITAMIN) TABS tablet Take 1 tablet by mouth daily at 12 noon.   02/01/2022   ursodiol (ACTIGALL) 300 MG capsule Take 300 mg by mouth 2 (two) times daily.   02/01/2022   Results for orders placed or performed during the hospital encounter of 02/01/22 (from the past 24 hour(s))  CBC with Differential/Platelet     Status: Abnormal   Collection Time: 02/01/22  9:20 PM  Result Value Ref Range   WBC 5.9 4.0 - 10.5 K/uL   RBC 4.10 3.87 - 5.11 MIL/uL   Hemoglobin 11.3 (L) 12.0 - 15.0  g/dL   HCT 76.8 (L) 11.5 - 72.6 %   MCV 84.1 80.0 - 100.0 fL   MCH 27.6 26.0 - 34.0 pg   MCHC 32.8 30.0 - 36.0 g/dL   RDW 20.3 55.9 - 74.1 %   Platelets 188 150 - 400 K/uL   nRBC 0.0 0.0 - 0.2 %   Neutrophils Relative % 61 %   Neutro Abs 3.6 1.7 - 7.7 K/uL   Lymphocytes Relative 28 %   Lymphs Abs 1.7 0.7 - 4.0 K/uL   Monocytes Relative 9 %   Monocytes Absolute 0.6 0.1 - 1.0 K/uL   Eosinophils Relative 2 %   Eosinophils Absolute 0.1 0.0 - 0.5 K/uL   Basophils Relative 0 %   Basophils Absolute 0.0 0.0 - 0.1 K/uL   Immature Granulocytes 0 %   Abs Immature Granulocytes 0.02 0.00 - 0.07 K/uL  Comprehensive metabolic panel     Status: Abnormal   Collection Time: 02/01/22  9:20 PM  Result  Value Ref Range   Sodium 137 135 - 145 mmol/L   Potassium 3.8 3.5 - 5.1 mmol/L   Chloride 106 98 - 111 mmol/L   CO2 24 22 - 32 mmol/L   Glucose, Bld 86 70 - 99 mg/dL   BUN 8 6 - 20 mg/dL   Creatinine, Ser 1.18 0.44 - 1.00 mg/dL   Calcium 8.7 (L) 8.9 - 10.3 mg/dL   Total Protein 6.6 6.5 - 8.1 g/dL   Albumin 2.9 (L) 3.5 - 5.0 g/dL   AST 59 (H) 15 - 41 U/L   ALT 84 (H) 0 - 44 U/L   Alkaline Phosphatase 217 (H) 38 - 126 U/L   Total Bilirubin 0.7 0.3 - 1.2 mg/dL   GFR, Estimated >86 >77 mL/min   Anion gap 7 5 - 15    Review of Systems  Constitutional:  Negative for fever.  Gastrointestinal:  Negative for abdominal pain.  Genitourinary:  Negative for dyspareunia and dysuria.   Physical Exam   Blood pressure 135/84, pulse 83, temperature 97.8 F (36.6 C), temperature source Oral, resp. rate 18, height 5\' 4"  (1.626 m), weight 113.9 kg, SpO2 97 %.  Physical Exam Vitals and nursing note reviewed.  Constitutional:      General: She is not in acute distress.    Appearance: She is well-developed. She is not ill-appearing, toxic-appearing or diaphoretic.  Eyes:     Pupils: Pupils are equal, round, and reactive to light.  Abdominal:     Tenderness: Negative signs include Murphy's sign.  Skin:     General: Skin is warm.  Neurological:     Mental Status: She is alert and oriented to person, place, and time.  Psychiatric:        Behavior: Behavior normal.   Fetal Tracing: Baseline: 125 bpm Variability: Moderate  Accelerations: 15x15 Decelerations: None Toco: Occasional   MAU Course  Procedures  MDM  CBC&CMP ordered RUQ stable; reviewed with Patient in detail.  Reviewed labs with Dr. [place]. AST and ALT mildly elevated.  Discussed patient with Dr. Shawnie Pons and recommended close office follow up.   0/10 pain at discharge, no nausea or vomiting.   Assessment and Plan   A:  1. Cholelithiasis affecting pregnancy in third trimester, antepartum   2. [redacted] weeks gestation of pregnancy      P:  Discharge home Discussed at home diet in detail; low fat, small meals Return to MAU if symptoms worsen Follow up early next week for labs.  Ok to use tylenol for pain management.   Sallye Ober, NP 02/02/2022 12:59 AM

## 2022-02-01 NOTE — MAU Note (Signed)
.  Diana Higgins is a 26 y.o. at [redacted]w[redacted]d here in MAU reporting: that she has pain in the upper right abdomen underneath the breast bone.  Pt states she has hx of gallstones 3-4 years ago.  Pt reports that her pain is a 10 when it happened but since waiting the lobby she no longer has pain.  Pt reports that she has ICP as well. Denies VB and LOF.  +FM     Onset of complaint: throughout pregnancy  Pain score: 0 Vitals:   02/01/22 2015 02/01/22 2016  BP:  135/84  Pulse:  83  Resp:  18  Temp:  97.8 F (36.6 C)  SpO2: 99% 97%     FHT:130 Lab orders placed from triage:    none

## 2022-02-12 ENCOUNTER — Ambulatory Visit (HOSPITAL_COMMUNITY): Payer: Medicaid Other | Attending: Cardiology

## 2022-02-12 DIAGNOSIS — R011 Cardiac murmur, unspecified: Secondary | ICD-10-CM | POA: Diagnosis present

## 2022-02-12 LAB — ECHOCARDIOGRAM COMPLETE
AR max vel: 1.99 cm2
AV Area VTI: 2.03 cm2
AV Area mean vel: 2.03 cm2
AV Mean grad: 8.1 mmHg
AV Peak grad: 15.6 mmHg
Ao pk vel: 1.98 m/s
Area-P 1/2: 3.48 cm2
S' Lateral: 2.6 cm

## 2022-03-07 LAB — OB RESULTS CONSOLE GBS: GBS: POSITIVE

## 2022-03-12 ENCOUNTER — Ambulatory Visit: Payer: Medicaid Other | Attending: Cardiovascular Disease | Admitting: Cardiology

## 2022-03-12 ENCOUNTER — Encounter: Payer: Self-pay | Admitting: Cardiology

## 2022-03-12 VITALS — Ht 64.0 in | Wt 250.0 lb

## 2022-03-12 DIAGNOSIS — Z3493 Encounter for supervision of normal pregnancy, unspecified, third trimester: Secondary | ICD-10-CM | POA: Diagnosis not present

## 2022-03-12 DIAGNOSIS — R002 Palpitations: Secondary | ICD-10-CM

## 2022-03-12 NOTE — Patient Instructions (Addendum)
Medication Instructions:  Your physician recommends that you continue on your current medications as directed. Please refer to the Current Medication list given to you today.  *If you need a refill on your cardiac medications before your next appointment, please call your pharmacy*   Lab Work: None   Testing/Procedures: None   Follow-Up: At [facility], you and your health needs are our priority.  As part of our continuing mission to provide you with exceptional heart care, we have created designated Provider Care Teams.  These Care Teams include your primary Cardiologist (physician) and Advanced Practice Providers (APPs -  Physician Assistants and Nurse Practitioners) who all work together to provide you with the care you need, when you need it.  We recommend signing up for the patient portal called "MyChart".  Sign up information is provided on this After Visit Summary.  MyChart is used to connect with patients for Virtual Visits (Telemedicine).  Patients are able to view lab/test results, encounter notes, upcoming appointments, etc.  Non-urgent messages can be sent to your provider as well.   To learn more about what you can do with MyChart, go to [email or web address].    Your next appointment:   10 week(s) postpartum. Please contact the office to make the appointment after you have the baby.   Provider:   Berniece Salines, DO     Other Instructions

## 2022-03-15 NOTE — Progress Notes (Signed)
Cardio-Obstetrics Clinic  Follow Up Note   Date:  03/15/2022   ID:  Diana Higgins, DOB 07-27-1995, MRN [id number]  PCP:  Patient, No Pcp Per   [facility] Providers Cardiologist:  Thomasene Ripple, DO  Electrophysiologist:  None        Referring MD: No ref. provider found   Chief Complaint: " I am doing ok"  History of Present Illness:    Diana Higgins is a 27 y.o. female [G2P0010] who returns for follow up visit. At her visit in November 2023 she was experiencing palpitations. We placed a monitor for 7 days   Since I saw the patient she has been diagnosed with cholelithiasis, being medically managed until she delivers.    Prior CV Studies Reviewed: The following studies were reviewed today:  Tte 01/2022     ECHOCARDIOGRAM REPORT       Patient Name:   Diana Higgins   Date of Exam: 02/12/2022 Medical Rec #:  [id number]     Height:       64.0 in Accession #:    [id number]    Weight:       251.2 lb Date of Birth:  Mar 31, 1995     BSA:          2.155 m Patient Age:    26 years      BP:           130/64 mmHg Patient Gender: F             HR:           98 bpm. Exam Location:  [address]  Procedure: 2D Echo, 3D Echo, Cardiac Doppler, Color Doppler and Strain Analysis  Indications:    R01.1 Murmur   History:        Patient has prior history of Echocardiogram examinations.                 Arrythmias:Tachycardia; Signs/Symptoms:Murmur, Edema and                 Shortness of Breath. Cardio-OB Echo, Pregnancy- 33 Weeks,                 Palpitations.   Sonographer:    Farrel Conners RDCS Referring Phys: Thomasene Ripple    Sonographer Comments: Cardio-OB Echo IMPRESSIONS    1. Left ventricular ejection fraction, by estimation, is 60 to 65%. The left ventricle has normal function. The left ventricle has no regional wall motion abnormalities. Left ventricular diastolic parameters were normal. The average left ventricular global longitudinal strain is -29.8 %. The global  longitudinal strain is normal.  2. Right ventricular systolic function is normal. The right ventricular size is normal.  3. The mitral valve is normal in structure. No evidence of mitral valve regurgitation. No evidence of mitral stenosis.  4. The aortic valve is normal in structure. Aortic valve regurgitation is not visualized. No aortic stenosis is present.  5. The inferior vena cava is normal in size with greater than 50% respiratory variability, suggesting right atrial pressure of 3 mmHg.  Comparison(s): Increased flow across all valves consistent with high cardiac output of third trimester pregnancy.  FINDINGS  Left Ventricle: Left ventricular ejection fraction, by estimation, is 60 to 65%. The left ventricle has normal function. The left ventricle has no regional wall motion abnormalities. The average left ventricular global longitudinal strain is -29.8 %. The global longitudinal strain is normal. 3D left ventricular ejection fraction analysis performed but not reported based on  interpreter judgement due to suboptimal tracking. The left ventricular internal cavity size was normal in size. There is no left ventricular hypertrophy. Left ventricular diastolic parameters were normal.  Right Ventricle: The right ventricular size is normal. No increase in right ventricular wall thickness. Right ventricular systolic function is normal.  Left Atrium: Left atrial size was normal in size.  Right Atrium: Right atrial size was normal in size.  Pericardium: There is no evidence of pericardial effusion.  Mitral Valve: The mitral valve is normal in structure. No evidence of mitral valve regurgitation. No evidence of mitral valve stenosis.  Tricuspid Valve: The tricuspid valve is normal in structure. Tricuspid valve regurgitation is not demonstrated. No evidence of tricuspid stenosis.  Aortic Valve: The aortic valve is normal in structure. Aortic valve regurgitation is not  visualized. No aortic stenosis is present. Aortic valve mean gradient measures 8.1 mmHg. Aortic valve peak gradient measures 15.6 mmHg. Aortic valve area, by VTI measures 2.03 cm.  Pulmonic Valve: The pulmonic valve was normal in structure. Pulmonic valve regurgitation is not visualized. No evidence of pulmonic stenosis.  Aorta: The aortic root is normal in size and structure.  Venous: The inferior vena cava is normal in size with greater than 50% respiratory variability, suggesting right atrial pressure of 3 mmHg.  IAS/Shunts: No atrial level shunt detected by color flow Doppler.   Zio monitor 12/2021 Patch Wear Time:  6 days and 14 hours (2023-11-16T20:25:20-0500 to 2023-11-23T11:18:28-0500) Patient had a min HR of 49 bpm, max HR of 165 bpm, and avg HR of 86 bpm. Predominant underlying rhythm was Sinus Rhythm. Isolated SVEs were rare (<1.0%), SVE Couplets were rare (<1.0%), and no SVE Triplets were present. Isolated VEs were rare (<1.0%), VE  Couplets were rare (<1.0%), and no VE Triplets were present.  Symptoms associated with sinus rhythm. Conclusion: Normal/unremarkable study.   Past Medical History:  Diagnosis Date   Gallstones    Heart murmur    since birth per pt    History reviewed. No pertinent surgical history.    OB History     Gravida  2   Para      Term      Preterm      AB  1   Living         SAB      IAB      Ectopic      Multiple      Live Births                  Current Medications: Current Meds  Medication Sig   Cholecalciferol (VITAMIN D-3 PO) Take 2 capsules by mouth daily.   Pantoprazole Sodium (PROTONIX PO) Take 1 tablet by mouth daily.   Prenatal Vit-Fe Fumarate-FA (PRENATAL MULTIVITAMIN) TABS tablet Take 1 tablet by mouth daily at 12 noon.   ursodiol (ACTIGALL) 300 MG capsule Take 300 mg by mouth 2 (two) times daily.     Allergies:   Patient has no known allergies.   Social History   Socioeconomic History   Marital  status: Single    Spouse name: Not on file   Number of children: Not on file   Years of education: Not on file   Highest education level: Not on file  Occupational History   Not on file  Tobacco Use   Smoking status: Never   Smokeless tobacco: Never  Vaping Use   Vaping Use: Never used  Substance and Sexual Activity   Alcohol use: Never  Drug use: Never   Sexual activity: Yes  Other Topics Concern   Not on file  Social History Narrative   Not on file   Social Determinants of Health   Financial Resource Strain: Not on file  Food Insecurity: Not on file  Transportation Needs: Not on file  Physical Activity: Not on file  Stress: Not on file  Social Connections: Not on file      History reviewed. No pertinent family history.    ROS:   Please see the history of present illness.     All other systems reviewed and are negative.   Labs/EKG Reviewed:    EKG:   EKG is was not ordered today.    Recent Labs: 02/01/2022: ALT 84; BUN 8; Creatinine, Ser 0.74; Hemoglobin 11.3; Platelets 188; Potassium 3.8; Sodium 137   Recent Lipid Panel No results found for: "CHOL", "TRIG", "HDL", "CHOLHDL", "LDLCALC", "LDLDIRECT"  Physical Exam:    VS:  Ht 5\' 4"  (1.626 m)   Wt 113.4 kg   BMI 42.91 kg/m     Wt Readings from Last 3 Encounters:  03/12/22 113.4 kg  02/01/22 113.9 kg  01/02/22 112.2 kg     GEN:  Well nourished, well developed in no acute distress HEENT: Normal NECK: No JVD; No carotid bruits LYMPHATICS: No lymphadenopathy CARDIAC: RRR, no murmurs, rubs, gallops RESPIRATORY:  Clear to auscultation without rales, wheezing or rhonchi  ABDOMEN: Soft, non-tender, non-distended MUSCULOSKELETAL:  No edema; No deformity  SKIN: Warm and dry NEUROLOGIC:  Alert and oriented x 3 PSYCHIATRIC:  Normal affect    Risk Assessment/Risk Calculators:     CARPREG II Risk Prediction Index Score:  1.  The patient's risk for a primary cardiac event is 5%.             ASSESSMENT & PLAN:    Palpitations - no recurrent palpitations. Discussed testing again with the patient.  Fu post-partum prior to intervention for cholelithiasis   Patient Instructions  Medication Instructions:  Your physician recommends that you continue on your current medications as directed. Please refer to the Current Medication list given to you today.  *If you need a refill on your cardiac medications before your next appointment, please call your pharmacy*   Lab Work: None   Testing/Procedures: None   Follow-Up: At [facility], you and your health needs are our priority.  As part of our continuing mission to provide you with exceptional heart care, we have created designated Provider Care Teams.  These Care Teams include your primary Cardiologist (physician) and Advanced Practice Providers (APPs -  Physician Assistants and Nurse Practitioners) who all work together to provide you with the care you need, when you need it.  We recommend signing up for the patient portal called "MyChart".  Sign up information is provided on this After Visit Summary.  MyChart is used to connect with patients for Virtual Visits (Telemedicine).  Patients are able to view lab/test results, encounter notes, upcoming appointments, etc.  Non-urgent messages can be sent to your provider as well.   To learn more about what you can do with MyChart, go to [facility].    Your next appointment:   10 week(s) postpartum. Please contact the office to make the appointment after you have the baby.   Provider:   [email or web address], DO     Other Instructions     Dispo:  No follow-ups on file.   Medication Adjustments/Labs and Tests Ordered: Current medicines are reviewed at length  with the patient today.  Concerns regarding medicines are outlined above.  Tests Ordered: No orders of the defined types were placed in this encounter.  Medication Changes: No orders of the defined types  were placed in this encounter.

## 2022-03-18 ENCOUNTER — Telehealth (HOSPITAL_COMMUNITY): Payer: Self-pay | Admitting: *Deleted

## 2022-03-18 ENCOUNTER — Encounter (HOSPITAL_COMMUNITY): Payer: Self-pay

## 2022-03-18 NOTE — Telephone Encounter (Signed)
Preadmission screen  

## 2022-03-19 ENCOUNTER — Encounter (HOSPITAL_COMMUNITY): Payer: Self-pay | Admitting: *Deleted

## 2022-03-19 ENCOUNTER — Telehealth (HOSPITAL_COMMUNITY): Payer: Self-pay | Admitting: *Deleted

## 2022-03-19 NOTE — Telephone Encounter (Signed)
Preadmission screen  

## 2022-03-20 ENCOUNTER — Other Ambulatory Visit (HOSPITAL_COMMUNITY): Payer: Self-pay | Admitting: Advanced Practice Midwife

## 2022-03-21 ENCOUNTER — Other Ambulatory Visit: Payer: Self-pay | Admitting: Advanced Practice Midwife

## 2022-03-25 ENCOUNTER — Inpatient Hospital Stay (HOSPITAL_COMMUNITY): Payer: Medicaid Other | Admitting: Anesthesiology

## 2022-03-25 ENCOUNTER — Inpatient Hospital Stay (HOSPITAL_COMMUNITY)
Admission: RE | Admit: 2022-03-25 | Discharge: 2022-03-29 | DRG: 786 | Disposition: A | Discharge status: Home / Self Care | Payer: Medicaid Other | Attending: Obstetrics & Gynecology | Admitting: Obstetrics & Gynecology

## 2022-03-25 ENCOUNTER — Encounter (HOSPITAL_COMMUNITY): Payer: Self-pay | Admitting: Obstetrics & Gynecology

## 2022-03-25 ENCOUNTER — Inpatient Hospital Stay (HOSPITAL_COMMUNITY): Payer: Medicaid Other

## 2022-03-25 DIAGNOSIS — O2662 Liver and biliary tract disorders in childbirth: Secondary | ICD-10-CM | POA: Diagnosis not present

## 2022-03-25 DIAGNOSIS — O99214 Obesity complicating childbirth: Secondary | ICD-10-CM | POA: Diagnosis present

## 2022-03-25 DIAGNOSIS — Z3A39 39 weeks gestation of pregnancy: Secondary | ICD-10-CM | POA: Diagnosis not present

## 2022-03-25 DIAGNOSIS — O99824 Streptococcus B carrier state complicating childbirth: Secondary | ICD-10-CM | POA: Diagnosis present

## 2022-03-25 DIAGNOSIS — O99820 Streptococcus B carrier state complicating pregnancy: Secondary | ICD-10-CM | POA: Diagnosis not present

## 2022-03-25 DIAGNOSIS — O134 Gestational [pregnancy-induced] hypertension without significant proteinuria, complicating childbirth: Secondary | ICD-10-CM | POA: Diagnosis present

## 2022-03-25 DIAGNOSIS — O26643 Intrahepatic cholestasis of pregnancy, third trimester: Principal | ICD-10-CM | POA: Diagnosis present

## 2022-03-25 DIAGNOSIS — D62 Acute posthemorrhagic anemia: Secondary | ICD-10-CM | POA: Diagnosis not present

## 2022-03-25 DIAGNOSIS — O133 Gestational [pregnancy-induced] hypertension without significant proteinuria, third trimester: Secondary | ICD-10-CM | POA: Insufficient documentation

## 2022-03-25 DIAGNOSIS — O9902 Anemia complicating childbirth: Secondary | ICD-10-CM | POA: Diagnosis present

## 2022-03-25 DIAGNOSIS — O164 Unspecified maternal hypertension, complicating childbirth: Secondary | ICD-10-CM | POA: Diagnosis not present

## 2022-03-25 DIAGNOSIS — Z98891 History of uterine scar from previous surgery: Secondary | ICD-10-CM

## 2022-03-25 DIAGNOSIS — Z3490 Encounter for supervision of normal pregnancy, unspecified, unspecified trimester: Secondary | ICD-10-CM | POA: Diagnosis present

## 2022-03-25 DIAGNOSIS — K831 Obstruction of bile duct: Secondary | ICD-10-CM | POA: Diagnosis present

## 2022-03-25 HISTORY — DX: Encounter for supervision of normal pregnancy, unspecified, unspecified trimester: Z34.90

## 2022-03-25 LAB — COMPREHENSIVE METABOLIC PANEL
ALT: 14 U/L (ref 0–44)
AST: 25 U/L (ref 15–41)
Albumin: 3 g/dL — ABNORMAL LOW (ref 3.5–5.0)
Alkaline Phosphatase: 170 U/L — ABNORMAL HIGH (ref 38–126)
Anion gap: 8 (ref 5–15)
BUN: 15 mg/dL (ref 6–20)
CO2: 23 mmol/L (ref 22–32)
Calcium: 9.5 mg/dL (ref 8.9–10.3)
Chloride: 104 mmol/L (ref 98–111)
Creatinine, Ser: 0.87 mg/dL (ref 0.44–1.00)
GFR, Estimated: >60 mL/min (ref >60)
Glucose, Bld: 86 mg/dL (ref 70–99)
Potassium: 3.9 mmol/L (ref 3.5–5.1)
Sodium: 135 mmol/L (ref 135–145)
Total Bilirubin: 0.4 mg/dL (ref 0.3–1.2)
Total Protein: 6.7 g/dL (ref 6.5–8.1)

## 2022-03-25 LAB — CBC
HCT: 37.4 % (ref 36.0–46.0)
HCT: 36.5 % (ref 36.0–46.0)
Hemoglobin: 12 g/dL (ref 12.0–15.0)
Hemoglobin: 12 g/dL (ref 12.0–15.0)
MCH: 26.9 pg (ref 26.0–34.0)
MCH: 27.2 pg (ref 26.0–34.0)
MCHC: 32.1 g/dL (ref 30.0–36.0)
MCHC: 32.9 g/dL (ref 30.0–36.0)
MCV: 83.9 fL (ref 80.0–100.0)
MCV: 82.8 fL (ref 80.0–100.0)
Platelets: 168 10*3/uL (ref 150–400)
Platelets: 179 10*3/uL (ref 150–400)
RBC: 4.46 MIL/uL (ref 3.87–5.11)
RBC: 4.41 MIL/uL (ref 3.87–5.11)
RDW: 12.6 % (ref 11.5–15.5)
RDW: 12.5 % (ref 11.5–15.5)
WBC: 4.6 10*3/uL (ref 4.0–10.5)
WBC: 8.1 10*3/uL (ref 4.0–10.5)
nRBC: 0 % (ref 0.0–0.2)
nRBC: 0 % (ref 0.0–0.2)

## 2022-03-25 LAB — TYPE AND SCREEN
ABO/RH(D): O POS
Antibody Screen: NEGATIVE

## 2022-03-25 LAB — RPR: RPR Ser Ql: NONREACTIVE

## 2022-03-25 MED ORDER — FENTANYL CITRATE (PF) 100 MCG/2ML IJ SOLN
50.0000 ug | INTRAMUSCULAR | Status: DC | PRN
  Administered 2022-03-25: 50 ug via INTRAVENOUS
  Administered 2022-03-25: 100 ug via INTRAVENOUS
  Filled 2022-03-25 (×2): qty 2

## 2022-03-25 MED ORDER — FENTANYL-BUPIVACAINE-NACL 0.5-0.125-0.9 MG/250ML-% EP SOLN
12.0000 mL/h | EPIDURAL | Status: DC | PRN
  Administered 2022-03-25 – 2022-03-26 (×2): 12 mL/h via EPIDURAL
  Filled 2022-03-25 (×2): qty 250

## 2022-03-25 MED ORDER — OXYCODONE-ACETAMINOPHEN 5-325 MG PO TABS: 1.0000 | ORAL_TABLET | ORAL | Status: DC | PRN

## 2022-03-25 MED ORDER — ACETAMINOPHEN 325 MG PO TABS: 650.0000 mg | ORAL_TABLET | ORAL | Status: DC | PRN

## 2022-03-25 MED ORDER — LACTATED RINGERS IV SOLN
500.0000 mL | INTRAVENOUS | Status: DC | PRN
  Administered 2022-03-26: 500 mL via INTRAVENOUS

## 2022-03-25 MED ORDER — LIDOCAINE HCL (PF) 1 % IJ SOLN
INTRAMUSCULAR | Status: DC | PRN
  Administered 2022-03-25: 5 mL via EPIDURAL
  Administered 2022-03-25: 3 mL via EPIDURAL
  Administered 2022-03-25: 2 mL via EPIDURAL

## 2022-03-25 MED ORDER — PENICILLIN G POT IN DEXTROSE 60000 UNIT/ML IV SOLN
3.0000 10*6.[IU] | INTRAVENOUS | Status: DC
  Administered 2022-03-25 – 2022-03-26 (×8): 3 10*6.[IU] via INTRAVENOUS
  Filled 2022-03-25 (×8): qty 50

## 2022-03-25 MED ORDER — TERBUTALINE SULFATE 1 MG/ML IJ SOLN: 0.2500 mg | Freq: Once | INTRAMUSCULAR | Status: DC | PRN

## 2022-03-25 MED ORDER — MISOPROSTOL 25 MCG QUARTER TABLET
25.0000 ug | ORAL_TABLET | Freq: Once | ORAL | Status: AC
  Administered 2022-03-25: 25 ug via ORAL
  Filled 2022-03-25: qty 1

## 2022-03-25 MED ORDER — OXYCODONE-ACETAMINOPHEN 5-325 MG PO TABS: 2.0000 | ORAL_TABLET | ORAL | Status: DC | PRN

## 2022-03-25 MED ORDER — ONDANSETRON HCL 4 MG/2ML IJ SOLN
4.0000 mg | Freq: Four times a day (QID) | INTRAMUSCULAR | Status: DC | PRN
  Administered 2022-03-27: 4 mg via INTRAVENOUS

## 2022-03-25 MED ORDER — LACTATED RINGERS IV SOLN: INTRAVENOUS | Status: DC

## 2022-03-25 MED ORDER — LACTATED RINGERS IV SOLN: 500.0000 mL | Freq: Once | INTRAVENOUS | Status: DC

## 2022-03-25 MED ORDER — SODIUM CHLORIDE 0.9 % IV SOLN
5.0000 10*6.[IU] | Freq: Once | INTRAVENOUS | Status: AC
  Administered 2022-03-25: 5 10*6.[IU] via INTRAVENOUS
  Filled 2022-03-25: qty 5

## 2022-03-25 MED ORDER — DIPHENHYDRAMINE HCL 50 MG/ML IJ SOLN: 12.5000 mg | INTRAMUSCULAR | Status: DC | PRN

## 2022-03-25 MED ORDER — LIDOCAINE HCL (PF) 1 % IJ SOLN: 30.0000 mL | INTRAMUSCULAR | Status: DC | PRN

## 2022-03-25 MED ORDER — PHENYLEPHRINE 80 MCG/ML (10ML) SYRINGE FOR IV PUSH (FOR BLOOD PRESSURE SUPPORT)
80.0000 ug | PREFILLED_SYRINGE | INTRAVENOUS | Status: DC | PRN
  Filled 2022-03-25: qty 10

## 2022-03-25 MED ORDER — LACTATED RINGERS IV SOLN
500.0000 mL | Freq: Once | INTRAVENOUS | Status: AC
  Administered 2022-03-25: 500 mL via INTRAVENOUS

## 2022-03-25 MED ORDER — PHENYLEPHRINE 80 MCG/ML (10ML) SYRINGE FOR IV PUSH (FOR BLOOD PRESSURE SUPPORT): 80.0000 ug | PREFILLED_SYRINGE | INTRAVENOUS | Status: DC | PRN

## 2022-03-25 MED ORDER — SOD CITRATE-CITRIC ACID 500-334 MG/5ML PO SOLN
30.0000 mL | ORAL | Status: DC | PRN
  Administered 2022-03-27: 30 mL via ORAL
  Filled 2022-03-25: qty 30

## 2022-03-25 MED ORDER — EPHEDRINE 5 MG/ML INJ: 10.0000 mg | INTRAVENOUS | Status: DC | PRN

## 2022-03-25 MED ORDER — MISOPROSTOL 25 MCG QUARTER TABLET
25.0000 ug | ORAL_TABLET | Freq: Once | ORAL | Status: AC
  Administered 2022-03-25: 25 ug via VAGINAL
  Filled 2022-03-25: qty 1

## 2022-03-25 MED ORDER — OXYTOCIN-SODIUM CHLORIDE 30-0.9 UT/500ML-% IV SOLN
2.5000 [IU]/h | INTRAVENOUS | Status: DC
  Administered 2022-03-27: 30 [IU] via INTRAVENOUS
  Filled 2022-03-25: qty 500

## 2022-03-25 MED ORDER — OXYTOCIN-SODIUM CHLORIDE 30-0.9 UT/500ML-% IV SOLN
1.0000 m[IU]/min | INTRAVENOUS | Status: DC
  Administered 2022-03-25: 2 m[IU]/min via INTRAVENOUS

## 2022-03-25 MED ORDER — OXYTOCIN BOLUS FROM INFUSION: 333.0000 mL | Freq: Once | INTRAVENOUS | Status: DC

## 2022-03-25 NOTE — Anesthesia Preprocedure Evaluation (Signed)
Anesthesia Evaluation  Patient identified by MRN, date of birth, ID band Patient awake    Reviewed: Allergy & Precautions, Patient's Chart, lab work & pertinent test results  Airway Mallampati: III  TM Distance: >3 FB     Dental   Pulmonary neg pulmonary ROS   Pulmonary exam normal        Cardiovascular hypertension, Normal cardiovascular exam     Neuro/Psych negative neurological ROS     GI/Hepatic negative GI ROS, Neg liver ROS,,,  Endo/Other    Morbid obesity  Renal/GU negative Renal ROS     Musculoskeletal   Abdominal   Peds  Hematology negative hematology ROS (+)   Anesthesia Other Findings   Reproductive/Obstetrics (+) Pregnancy                             Anesthesia Physical Anesthesia Plan  ASA: 3  Anesthesia Plan: Epidural   Post-op Pain Management:    Induction:   PONV Risk Score and Plan: 2 and Treatment may vary due to age or medical condition  Airway Management Planned: Natural Airway  Additional Equipment:   Intra-op Plan:   Post-operative Plan:   Informed Consent: I have reviewed the patients History and Physical, chart, labs and discussed the procedure including the risks, benefits and alternatives for the proposed anesthesia with the patient or authorized representative who has indicated his/her understanding and acceptance.       Plan Discussed with:   Anesthesia Plan Comments:        Anesthesia Quick Evaluation

## 2022-03-25 NOTE — Progress Notes (Signed)
Diana Higgins is a 27 y.o. G2P0010 at [redacted]w[redacted]d admitted for induction of labor for cholestasis of pregnancy,   Subjective: Patient comfortable with epidural.   Objective: BP (!) 146/81   Pulse (!) 54   Temp 98.2 F (36.8 C) (Oral)   Resp 16   Ht 5\' 4"  (1.626 m)   Wt 117.9 kg   BMI 44.63 kg/m  No intake/output data recorded. No intake/output data recorded.  FHT:  FHR: 130 bpm, variability: moderate,  accelerations:  Present,  decelerations:  Absent UC:   irregular, every 1 to  3  minutes SVE:   Dilation: 4.5 Effacement (%): 80 Station: 0 Exam by:: Myrtie Soman, RN  Labs: Lab Results  Component Value Date   WBC 8.1 03/25/2022   HGB 12.0 03/25/2022   HCT 36.5 03/25/2022   MCV 82.8 03/25/2022   PLT 179 03/25/2022   CMP     Component Value Date/Time   NA 135 03/25/2022 0840   K 3.9 03/25/2022 0840   CL 104 03/25/2022 0840   CO2 23 03/25/2022 0840   GLUCOSE 86 03/25/2022 0840   BUN 15 03/25/2022 0840   CREATININE 0.87 03/25/2022 0840   CALCIUM 9.5 03/25/2022 0840   PROT 6.7 03/25/2022 0840   ALBUMIN 3.0 (L) 03/25/2022 0840   AST 25 03/25/2022 0840   ALT 14 03/25/2022 0840   ALKPHOS 170 (H) 03/25/2022 0840   BILITOT 0.4 03/25/2022 0840   GFRNONAA >60 03/25/2022 0840   GFRAA >60 07/08/2018 0057     Assessment / Plan:  27 y.o. G2P0010 at [redacted]w[redacted]d admitted for induction of labor for cholestasis of pregnancy,   Labor:  Progressing well on pitocin and s/p AROM .  Preeclampsia:   None Fetal Wellbeing:  Category I Pain Control:  Epidural I/D:   GBS positive and on penicillin.  Anticipated MOD:  NSVD.  Archie Endo, MD 03/25/2022, 11:55 PM

## 2022-03-25 NOTE — Progress Notes (Signed)
  Labor Progress Note  Diana Higgins is a 27 y.o. female, G2P0010, IUP at 39 weeks, presenting for IOL for ICP on ursodiol, bile acid never elevated above range, but has s/sx of itching with bila acid of 9 and h/o gallstones. H/O tachycardia in pregnancy clear by cards. Obesity BMI 36.Plans for f/u OP for gallbladder removed PP. Vit D def. HPV.    Subjective: Pt stable in bed resting well, was on the ball, pt tolerates cxt very well, denies feeling pain, progressing in latent labor. Discussed AROM R/B/A pt verbalized consent.  Patient Active Problem List   Diagnosis Date Noted   Encounter for induction of labor 03/25/2022   Objective: BP 117/60   Pulse 65   Temp 98.4 F (36.9 C) (Oral)   Resp 16   Ht 5\' 4"  (1.626 m)   Wt 117.9 kg   BMI 44.63 kg/m  No intake/output data recorded. No intake/output data recorded. NST: FHR baseline 130 bpm, Variability: moderate, Accelerations:present, Decelerations:  Absent= Cat 1/Reactive CTX:  irregular, every 2-7 minutes Uterus gravid, soft non tender, mild to palpate with contractions.  SVE:  Dilation: 4.5 Effacement (%): 70 Station: -2 Exam by:: [facility] CNM Pitocin at 10 mUn/min AROM, tolerated well, moderate, clear.   Assessment:  Diana Higgins is a 27 y.o. female, G2P0010, IUP at 39 weeks, presenting for IOL for ICP on ursodiol, bile acid never elevated above range, but has s/sx of itching with bila acid of 9 and h/o gallstones. H/O tachycardia in pregnancy clear by cards. Obesity BMI 36.Plans for f/u OP for gallbladder removed PP. Vit D def. HPV.  Progressing in latent labor starting on pitocin Patient Active Problem List   Diagnosis Date Noted   Encounter for induction of labor 03/25/2022  NICHD: Category 1  Membranes: AROM, cleat @ 7371 1/30, no s/s of infection  Induction:    Cytotec x31mcg [facility]/PO 61mcg @ 0626  Foley Bulb: Not yet.   Pitocin - 10  Pain management:               IV pain management: x PRN  Nitrous:  PRN              Epidural placement: PRN  GBS Positive  Abx: PCN 1/30 @ 9485, 4627, 0350  Plan: Continue labor plan Continuous monitoring Rest Ambulate Frequent position changes to facilitate fetal rotation and descent. Will reassess with cervical exam at 4 hours or earlier if necessary Continue pitocin 2x2 per protocol  Anticipate labor progression and vaginal delivery.   DR Alesia Richards updated and will assume care of pt at [place], FNP-C, PMHNP-BC  [address] [place], [place] [address]  Cell: [phone]  Office Phone: [phone] Fax: [phone] 03/25/2022  6:21 PM

## 2022-03-25 NOTE — Progress Notes (Signed)
  Labor Progress Note  Diana Higgins is a 27 y.o. female, G2P0010, IUP at 39 weeks, presenting for IOL for ICP on ursodiol, bile acid never elevated above range, but has s/sx of itching with bila acid of 9 and h/o gallstones. H/O tachycardia in pregnancy clear by cards. Obesity BMI 36.Plans for f/u OP for gallbladder removed PP. Vit D def. HPV.    Subjective: Pt stable in bed. Husband supportive and at bedside. Pt not feeling cxt but progressing in latent labor. Discussed starting pitocin 2x2, discussed R/B/A pt verbalized consent to start.  Patient Active Problem List   Diagnosis Date Noted   Encounter for induction of labor 03/25/2022   Objective: BP 132/73   Pulse 78   Temp 98.4 F (36.9 C) (Oral)   Resp 16   Ht 5\' 4"  (1.626 m)   Wt 117.9 kg   BMI 44.63 kg/m  No intake/output data recorded. No intake/output data recorded. NST: FHR baseline 130 bpm, Variability: moderate, Accelerations:present, Decelerations:  Absent= Cat 1/Reactive CTX:  irregular, every 2-7 minutes Uterus gravid, soft non tender, mild to palpate with contractions.  SVE:  Dilation: 3 Effacement (%): 60 Station: -3 Exam by:: [employer] CNM Pitocin at 2 mUn/min  Assessment:  Diana Higgins is a 27 y.o. female, G2P0010, IUP at 39 weeks, presenting for IOL for ICP on ursodiol, bile acid never elevated above range, but has s/sx of itching with bila acid of 9 and h/o gallstones. H/O tachycardia in pregnancy clear by cards. Obesity BMI 36.Plans for f/u OP for gallbladder removed PP. Vit D def. HPV.  Progressing in latent labor starting on pitocin Patient Active Problem List   Diagnosis Date Noted   Encounter for induction of labor 03/25/2022  NICHD: Category 1  Membranes: Intact, no s/s of infection  Induction:    Cytotec x27mcg [facility]/PO 27mcg @ 0955  Foley Bulb: Not yet.   Pitocin - starting at 2 now  Pain management:               IV pain management: x PRN  Nitrous:  PRN             Epidural placement:  PRN  GBS Positive  Abx: PCN 1/30 @ 5093, 2671  Plan: Continue labor plan Continuous monitoring Rest Ambulate Frequent position changes to facilitate fetal rotation and descent. Will reassess with cervical exam at 4 hours or earlier if necessary Start pitocin 2x2 per protocol  Anticipate labor progression and vaginal delivery.   [place], FNP-C, PMHNP-BC  [address] [place], [place] [address]  Cell: [phone]  Office Phone: [phone] Fax: [phone] 03/25/2022  2:26 PM

## 2022-03-25 NOTE — Anesthesia Procedure Notes (Signed)
Epidural Patient location during procedure: OB Start time: 03/25/2022 11:39 AM End time: 03/25/2022 11:49 AM  Staffing Anesthesiologist: Suzette Battiest, MD Performed: anesthesiologist   Preanesthetic Checklist Completed: patient identified, IV checked, site marked, risks and benefits discussed, surgical consent, monitors and equipment checked, pre-op evaluation and timeout performed  Epidural Patient position: sitting Prep: DuraPrep and site prepped and draped Patient monitoring: continuous pulse ox and blood pressure Approach: midline Location: L3-L4 Injection technique: LOR air  Needle:  Needle type: Tuohy  Needle gauge: 17 G Needle length: 9 cm and 9 Needle insertion depth: 7 cm Catheter type: closed end flexible Catheter size: 19 Gauge Catheter at skin depth: 14 (12-->14cm when sat upright.) cm Test dose: negative  Assessment Events: blood not aspirated, no cerebrospinal fluid, injection not painful, no injection resistance, no paresthesia and negative IV test

## 2022-03-25 NOTE — H&P (Signed)
Diana Higgins is a 27 y.o. female, G2P0010, IUP at 39 weeks, presenting for IOL for ICP on ursodiol, bile acid never elevated above range, but has s/sx of itching with bila acid of 9 and h/o gallstones. H/O tachycardia in pregnancy clear by cards. Obesity BMI 36.Plans for f/u OP for gallbladder removed PP. Vit D def. HPV.   Patient Active Problem List   Diagnosis Date Noted   Encounter for induction of labor 03/25/2022     Active Ambulatory Problems    Diagnosis Date Noted   No Active Ambulatory Problems   Resolved Ambulatory Problems    Diagnosis Date Noted   No Resolved Ambulatory Problems   Past Medical History:  Diagnosis Date   Cholestasis during pregnancy    Gallstones    Heart murmur       Medications Prior to Admission  Medication Sig Dispense Refill Last Dose   Cholecalciferol (VITAMIN D-3 PO) Take 2 capsules by mouth daily.      Pantoprazole Sodium (PROTONIX PO) Take 1 tablet by mouth daily.      Prenatal Vit-Fe Fumarate-FA (PRENATAL MULTIVITAMIN) TABS tablet Take 1 tablet by mouth daily at 12 noon.      ursodiol (ACTIGALL) 300 MG capsule Take 300 mg by mouth 2 (two) times daily.       Past Medical History:  Diagnosis Date   Cholestasis during pregnancy    Gallstones    Heart murmur    since birth per pt     No current facility-administered medications on file prior to encounter.   Current Outpatient Medications on File Prior to Encounter  Medication Sig Dispense Refill   Cholecalciferol (VITAMIN D-3 PO) Take 2 capsules by mouth daily.     Pantoprazole Sodium (PROTONIX PO) Take 1 tablet by mouth daily.     Prenatal Vit-Fe Fumarate-FA (PRENATAL MULTIVITAMIN) TABS tablet Take 1 tablet by mouth daily at 12 noon.     ursodiol (ACTIGALL) 300 MG capsule Take 300 mg by mouth 2 (two) times daily.     [DISCONTINUED] dicyclomine (BENTYL) 20 MG tablet Take 1 tablet (20 mg total) by mouth 2 (two) times daily. (Patient not taking: Reported on 07/08/2018) 20 tablet 0    [DISCONTINUED] diphenhydrAMINE (BENADRYL) 25 MG tablet Take 1 tablet (25 mg total) by mouth every 6 (six) hours. (Patient not taking: Reported on 01/27/2018) 20 tablet 0   [DISCONTINUED] famotidine (PEPCID) 20 MG tablet Take 1 tablet (20 mg total) by mouth 2 (two) times daily. (Patient not taking: Reported on 01/27/2018) 10 tablet 0     No Known Allergies  History of present pregnancy: Pt Info/Preference:  Screening/Consents:  Labs:   EDD: Estimated Date of Delivery: 04/01/22  Establised: No LMP recorded. Patient is pregnant.  Anatomy Scan: Date: 12/12/2021 Placenta Location: posterior Genetic Screen: Panoroma:LR AFP: Neg First Tri: Quad: Horizon: Neg  Office: [facility]            First PNV: 10 weeks Blood Type --/--/O POS (01/30 0840)  Language: english Last PNV: 38 weeks Rhogam    Flu Vaccine:  declined   Antibody NEG (01/30 0840)  TDaP vaccine declined   GTT: Early: 5.2 Third Trimester: WNL  Feeding Plan: breast BTL: no Rubella: Immune (07/11 0000)  Contraception: ??? VBAC: no RPR: Nonreactive (07/11 0000)   Circumcision: ???   HBsAg: Negative (07/11 0000)  Pediatrician:  ???   HIV: Non-reactive (07/11 0000)   Prenatal Classes: no Additional [place]: Yes BPP 8/8 GBS: Positive/-- (01/12 0000)(For PCN  allergy, check sensitivities)       Chlamydia: neg    MFM Referral/Consult:  GC: neg  Support Person: husband   PAP: 2023 HPV  Pain Management: Natural as possible Neonatologist Referral:  Hgb Electrophoresis:  AA  Birth Plan: [place]   Hgb NOB: 11.9    28W: 11.3   OB History     Gravida  2   Para      Term      Preterm      AB  1   Living         SAB      IAB      Ectopic      Multiple      Live Births             Past Medical History:  Diagnosis Date   Cholestasis during pregnancy    Gallstones    Heart murmur    since birth per pt   Past Surgical History:  Procedure Laterality Date   INDUCED ABORTION     Family History: family history includes Hypertension in  her father; Kidney disease in her father. Social History:  reports that she has never smoked. She has never used smokeless tobacco. She reports that she does not drink alcohol and does not use drugs.   Prenatal Transfer Tool  Maternal Diabetes: No Genetic Screening: Normal Maternal Ultrasounds/Referrals: Normal Fetal Ultrasounds or other Referrals:  Referred to Materal Fetal Medicine  Maternal Substance Abuse:  No Significant Maternal Medications:  Meds include: Other: ursodiol  Significant Maternal Lab Results: Group B Strep positive  ROS:  Review of Systems  Constitutional: Negative.   HENT: Negative.    Eyes: Negative.   Respiratory: Negative.    Cardiovascular: Negative.   Gastrointestinal: Negative.   Genitourinary: Negative.   Musculoskeletal: Negative.   Skin: Negative.   Neurological: Negative.   Endo/Heme/Allergies: Negative.   Psychiatric/Behavioral: Negative.       Physical Exam: BP 139/86   Pulse 75   Temp 98.2 F (36.8 C) (Oral)   Resp 16   Ht 5\' 4"  (1.626 m)   Wt 117.9 kg   BMI 44.63 kg/m   Physical Exam Vitals and nursing note reviewed.  Constitutional:      Appearance: Normal appearance.  HENT:     Head: Normocephalic and atraumatic.     Nose: Nose normal.     Mouth/Throat:     Mouth: Mucous membranes are moist.  Eyes:     Pupils: Pupils are equal, round, and reactive to light.  Cardiovascular:     Rate and Rhythm: Normal rate and regular rhythm.     Pulses: Normal pulses.     Heart sounds: Normal heart sounds.  Pulmonary:     Effort: Pulmonary effort is normal.     Breath sounds: Normal breath sounds.  Abdominal:     General: Bowel sounds are normal.  Genitourinary:    Comments: Uterus gravida equal to dates, pelvis adequate.  Musculoskeletal:     Cervical back: Normal range of motion and neck supple.  Skin:    General: Skin is warm.     Capillary Refill: Capillary refill takes less than 2 seconds.  Neurological:     General: No  focal deficit present.     Mental Status: She is alert.  Psychiatric:     Comments: Anxious       NST: FHR baseline 145 bpm, Variability: moderate, Accelerations:present, Decelerations:  Absent= Cat 1/Reactive UC:   None  SVE:   Dilation: 1.5 Effacement (%): 50 Station: -2 Exam by:: Joylene Igo RN, vertex verified by fetal sutures.  Leopold's: Position vertex, EFW 7.5lbs via leopold's.   Labs: Results for orders placed or performed during the hospital encounter of 03/25/22 (from the past 24 hour(s))  CBC     Status: None   Collection Time: 03/25/22  8:40 AM  Result Value Ref Range   WBC 4.6 4.0 - 10.5 K/uL   RBC 4.46 3.87 - 5.11 MIL/uL   Hemoglobin 12.0 12.0 - 15.0 g/dL   HCT 37.4 36.0 - 46.0 %   MCV 83.9 80.0 - 100.0 fL   MCH 26.9 26.0 - 34.0 pg   MCHC 32.1 30.0 - 36.0 g/dL   RDW 12.6 11.5 - 15.5 %   Platelets 168 150 - 400 K/uL   nRBC 0.0 0.0 - 0.2 %  Type and screen [place]     Status: None   Collection Time: 03/25/22  8:40 AM  Result Value Ref Range   ABO/RH(D) O POS    Antibody Screen NEG    Sample Expiration      03/28/2022,2359 Performed at [facility], [place] [address], [place], [place] [address]     Imaging:  No results found.  MAU Course: Orders Placed This Encounter  Procedures   OB RESULTS CONSOLE RPR   OB RESULTS CONSOLE Hepatitis B surface antigen   CBC   RPR   Comprehensive metabolic panel   Diet regular Room service appropriate? Yes; Fluid consistency: Thin   Vitals signs per unit policy   Notify physician (specify)   Fetal monitoring per unit policy   Activity as tolerated   Cervical Exam   Measure blood pressure post delivery every 15 min x 1 hour then every 30 min x 1 hour   Fundal check post delivery every 15 min x 1 hour then every 30 min x 1 hour   Apply Labor & Delivery Care Plan   If Rapid HIV test positive or known HIV positive: initiate AZT orders   May in and out cath x 2 for inability to void    Insert urethral catheter X 1 PRN If Coude Catheter is chosen, qualified resources by campus can be found in the clinical skills nursing procedure for Coude Catheter 1. If straight catheterized > 2 times or patient unable to void post epidural plac...   Refer to Sidebar Report Urinary (Foley) Catheter Indications   Refer to Sidebar Report Post Indwelling Urinary Catheter Removal and Intervention Guidelines   Discontinue foley prior to vaginal delivery   Initiate Carrier Fluid Protocol   Initiate Oral Care Protocol   Patient may have epidural placement upon request   Evaluate fetal heart rate to establish reassuring pattern prior to initiating Cytotec or Pitocin   Perform a cervical exam prior to initiating Cytotec or Pitocin   Discontinue Pitocin if tachysystole with non-reassuring FHR is present   Notify physician (specify) Tachysystole is defined as more than 5 contractions in a 10-minute time period averaged over a 30-minute window   Initiate intrauterine resuscitation if tachysystole with non-reassuring FHR is present   Notify physician (specify) Tachysystole is defined as more than 5 contractions in a 10-minute time period averaged over a 30-minute window   May administer Terbutaline 0.25 mg SQ x 1 dose if tachysystole with non-reassuring FHR is present   Labor Induction   Full code   Nitrous Oxide 50%/Oxygen 50%   Type and screen  [facility]   Insert and maintain IV Line   Admit to Inpatient (patient's expected length of stay will be greater than 2 midnights or inpatient only procedure)   Meds ordered this encounter  Medications   lactated ringers infusion   oxytocin (PITOCIN) IV BOLUS FROM BAG   oxytocin (PITOCIN) IV infusion 30 units in NS 500 mL - Premix   lactated ringers infusion 500-1,000 mL   acetaminophen (TYLENOL) tablet 650 mg   oxyCODONE-acetaminophen (PERCOCET/ROXICET) 5-325 MG per tablet 1 tablet   oxyCODONE-acetaminophen (PERCOCET/ROXICET) 5-325 MG per  tablet 2 tablet   ondansetron (ZOFRAN) injection 4 mg   sodium citrate-citric acid (ORACIT) solution 30 mL   lidocaine (PF) (XYLOCAINE) 1 % injection 30 mL   FOLLOWED BY Linked Order Group    penicillin G potassium 5 Million Units in sodium chloride 0.9 % 250 mL IVPB     Order Specific Question:   Antibiotic Indication:     Answer:   Group B Strep Prophylaxis    penicillin G potassium 3 Million Units in dextrose 76mL IVPB     Order Specific Question:   Antibiotic Indication:     Answer:   Group B Strep Prophylaxis   fentaNYL (SUBLIMAZE) injection 50-100 mcg   terbutaline (BRETHINE) injection 0.25 mg   AND Linked Order Group    misoprostol (CYTOTEC) tablet 25 mcg    misoprostol (CYTOTEC) tablet 25 mcg    Assessment/Plan: Sherrel Ploch is a 27 y.o. female, G2P0010, IUP at 39 weeks, presenting for IOL for ICP on ursodiol, bile acid never elevated above range, but has s/sx of itching with bila acid of 9 and h/o gallstones. H/O tachycardia in pregnancy clear by cards. Obesity BMI 36.Plans for f/u OP for gallbladder removed PP. Vit D def. HPV.   FWB: Cat 1 Fetal Tracing.   I discussed with patient risks, benefits and alternatives of labor induction including higher risk of cesarean delivery compared to spontaneous labor.  We discussed risks of induction agents including effects on fetal heart beat, contraction pattern and need for close monitoring.  Patient expressed understanding of all this and desired to proceed with the induction. Risks and benefits of induction were reviewed, including failure of method, prolonged labor, need for further intervention, risk of cesarean.  Patient and family verbalized understanding and denies any further questions at this time. Pt and family wish to proceed with induction process. Discussed induction options of Cytotec, foley bulb, AROM, and pitocin were reviewed as well as risks and benefits with use of each discussed.  Plan: Admit to Birthing Suite per  consult with Dr Sallye Ober Routine [facility] orders Pain med/epidural prn PCN G for GBS prophylaxis  Start IOL with cyctotec for cervical ripening 25/53mcg Anticipate labor progression   [facility], FNP-C, PMHNP-BC  [address]  [place], [place] [address]  Cell: [phone]  Office Phone: [phone] Fax: [phone] 03/25/2022  10:01 AM

## 2022-03-26 ENCOUNTER — Other Ambulatory Visit: Payer: Self-pay

## 2022-03-26 LAB — PROTEIN / CREATININE RATIO, URINE
Creatinine, Urine: 127 mg/dL
Protein Creatinine Ratio: 0.09 mg/mg{Cre} (ref 0.00–0.15)
Total Protein, Urine: 11 mg/dL

## 2022-03-26 MED ORDER — LACTATED RINGERS AMNIOINFUSION: INTRAVENOUS | Status: DC

## 2022-03-26 NOTE — Progress Notes (Signed)
OB Progress Note  Returned to room to discuss patient's decision regarding cesarean delivery. Fortunately, fetal heart rate baseline has been 150bpm, moderate variability, + accel, - decel. Contractions every irregular, MVUs ~45.   We had an extensive discussion regarding her cholestasis diagnosis and what led her to induction of labor here. We reviewed her cervical dilation and fetal caput. We discussed that the fetal heart rate has intermittently been Category II throughout her labor course but overall reassuring until the prolonged decelerations from the previous encounter. We had an extensive discussion regarding cesarean delivery, recovery, complications. I counseled the patient that at this stage she has met criteria for arrest of dilation. Her membranes have been ruptured for greater than 24 hours which increases risk for chorioamnionitis. Patient desires to repeat cervical exam at 2330 and will consider cesarean delivery at that time.  Drema Dallas, DO

## 2022-03-26 NOTE — Progress Notes (Signed)
Mila Pair is a 27 y.o. G2P0010 at [redacted]w[redacted]d by ultrasound admitted for induction of labor due to Hypertension.  Subjective: No headache, LOF or VB  Objective: BP (!) 140/88   Pulse 81   Temp (!) 97.2 F (36.2 C) (Oral)   Resp 16   Ht 5\' 4"  (1.626 m)   Wt 117.9 kg   BMI 44.63 kg/m  No intake/output data recorded. Total I/O In: -  Out: 58 [Urine:1100]  FHT:  FHR: 130 bpm, variability: moderate,  accelerations:  Present,  decelerations:  Present mild variables UC:   regular, every 2 minutes SVE:   Dilation: 7 Effacement (%):  (swollen) Station: Plus 1 Exam by:: Arhaan Chesnut  Labs: Lab Results  Component Value Date   WBC 8.1 03/25/2022   HGB 12.0 03/25/2022   HCT 36.5 03/25/2022   MCV 82.8 03/25/2022   PLT 179 03/25/2022    Assessment / Plan: Augmentation of labor, progressing well  Labor: Progressing normally Preeclampsia:  no signs or symptoms of toxicity Fetal Wellbeing:  Category I Pain Control:  Epidural I/D:  n/a Anticipated MOD:  NSVD  Jahnessa Vanduyn A Kaytlen Lightsey 03/26/2022, 1:55 PM

## 2022-03-26 NOTE — Progress Notes (Signed)
OB Progress Note  Returned to room to discuss and re-evaluate for cervical change; however, patient requests more time and space.  Drema Dallas, DO

## 2022-03-26 NOTE — Progress Notes (Signed)
OB Progress Note  Arrived to room to evaluate for cervical change. Cervix 9cm with very thick/edematous anterior lip, significant caput to +1 but bony parts to -1 to 0 station. Pitocin at 37mU/min. IUPC was readjusted and zeroed. After cervical exam, fetal heart rate deceleration to 90s for 6 minutes, brief recovery after repositioning  and another prolonged deceleration to the 90s for approximately 2 minutes. Fetal heart rate recovered to  140s-150s with moderate variability. Contractions occurring every 1-2 minutes. Pitocin turned off and IVF bolus administered.  I counseled the patient that after this episode of prolonged fetal heart rate deceleration in conjunction with lack of cervical change that I recommend proceeding with cesarean delivery. Patient is incredibly frustrated with this prospect given her plans to have a natural delivery. She verbalizes that she never wanted an induction of labor as she did not feel her body was ready. She states that she did not desire pitocin and had concerns about the epidural. Reports an area on the left lower side that she is having pain with. Now, she states she feels like everything is going against what she anticipated/planned for her delivery. I counseled the patient that obstetrics can be unpredictable and that this reality could have been possible had she been farther along in gestational age.  I counseled the patient regarding the risks of cesarean delivery which include but are not limited to infection, injury to surrounding organs, blood loss which could require blood transfusion, VTE and, pain after surgery. We discussed expectations for healing postpartum. We discussed immediate skin-to-skin, cord-clamping, and breastfeeding as soon as she desires. I counseled the patient regarding pain management options postpartum as well.  Patient and her significant other desire to discuss in private. After discussion, primary RN went back into the room. The patient  requests giving her body time to see if it will labor/make cervical change on its own. She would like to make a decision by 10:30pm this evening. Will return to room for further discussion regarding recommendation.  Drema Dallas, DO

## 2022-03-26 NOTE — Progress Notes (Signed)
PT comfortable with epidural BP 118/75   Pulse 88   Temp 98 F (36.7 C)   Resp 16   Ht 5\' 4"  (1.626 m)   Wt 117.9 kg   BMI 44.63 kg/m  Cat 1 Toco q 77min Cx 9/plus two with right side swollen and caput Will try to lay on right side Anticipate SVD

## 2022-03-26 NOTE — Progress Notes (Signed)
Pearl Bents is a 27 y.o. G2P0010 at [redacted]w[redacted]d admitted for induction of labor for cholestasis of pregnancy,    Subjective:  Patient is comfortable with epidural.   Objective: BP 130/83   Pulse 83   Temp 97.7 F (36.5 C) (Oral)   Resp 16   Ht 5\' 4"  (1.626 m)   Wt 117.9 kg   BMI 44.63 kg/m  No intake/output data recorded. No intake/output data recorded.  FHT:  FHR: 120 bpm, variability: moderate,  accelerations:  Present,  decelerations:  Present Prolonged. UC:   irregular, every 2 to 3  minutes SVE:   Dilation: 5 Effacement (%): 90 Station:  -1 Exam by:: Dr Alesia Richards IUPC placed in after discussing with patient risks and benefits of IUPC.   Labs: Lab Results  Component Value Date   WBC 8.1 03/25/2022   HGB 12.0 03/25/2022   HCT 36.5 03/25/2022   MCV 82.8 03/25/2022   PLT 179 03/25/2022    Assessment / Plan: 27 y.o. G2P0010 at [redacted]w[redacted]d EGA admitted for induction of labor for cholestasis of pregnancy,    Labor: On pitocin and s/p AROM.  Preeclampsia:   None Fetal Wellbeing:  Category II, c/w close monitoring and resuscitative efforts as needed.  Pain Control:  Epidural I/D:   GBS positive and on penicillin.  Anticipated MOD:  NSVD.   Archie Endo, MD 03/26/2022, 3:50 AM

## 2022-03-27 ENCOUNTER — Encounter (HOSPITAL_COMMUNITY)
Admission: RE | Disposition: A | Discharge status: Home / Self Care | Payer: Self-pay | Source: Home / Self Care | Attending: Obstetrics & Gynecology

## 2022-03-27 ENCOUNTER — Other Ambulatory Visit: Payer: Self-pay

## 2022-03-27 ENCOUNTER — Encounter (HOSPITAL_COMMUNITY): Payer: Self-pay | Admitting: Obstetrics & Gynecology

## 2022-03-27 DIAGNOSIS — Z98891 History of uterine scar from previous surgery: Secondary | ICD-10-CM

## 2022-03-27 DIAGNOSIS — O164 Unspecified maternal hypertension, complicating childbirth: Secondary | ICD-10-CM

## 2022-03-27 DIAGNOSIS — Z3A39 39 weeks gestation of pregnancy: Secondary | ICD-10-CM

## 2022-03-27 DIAGNOSIS — O99820 Streptococcus B carrier state complicating pregnancy: Secondary | ICD-10-CM

## 2022-03-27 DIAGNOSIS — O134 Gestational [pregnancy-induced] hypertension without significant proteinuria, complicating childbirth: Secondary | ICD-10-CM

## 2022-03-27 DIAGNOSIS — O133 Gestational [pregnancy-induced] hypertension without significant proteinuria, third trimester: Secondary | ICD-10-CM | POA: Insufficient documentation

## 2022-03-27 DIAGNOSIS — O2662 Liver and biliary tract disorders in childbirth: Secondary | ICD-10-CM

## 2022-03-27 HISTORY — DX: Gestational (pregnancy-induced) hypertension without significant proteinuria, third trimester: O13.3

## 2022-03-27 LAB — CBC
HCT: 30.4 % — ABNORMAL LOW (ref 36.0–46.0)
HCT: 27.5 % — ABNORMAL LOW (ref 36.0–46.0)
HCT: 25.2 % — ABNORMAL LOW (ref 36.0–46.0)
Hemoglobin: 10.1 g/dL — ABNORMAL LOW (ref 12.0–15.0)
Hemoglobin: 9.2 g/dL — ABNORMAL LOW (ref 12.0–15.0)
Hemoglobin: 8.3 g/dL — ABNORMAL LOW (ref 12.0–15.0)
MCH: 27.6 pg (ref 26.0–34.0)
MCH: 27.6 pg (ref 26.0–34.0)
MCH: 27.1 pg (ref 26.0–34.0)
MCHC: 33.2 g/dL (ref 30.0–36.0)
MCHC: 33.5 g/dL (ref 30.0–36.0)
MCHC: 32.9 g/dL (ref 30.0–36.0)
MCV: 83.1 fL (ref 80.0–100.0)
MCV: 82.6 fL (ref 80.0–100.0)
MCV: 82.4 fL (ref 80.0–100.0)
Platelets: 155 10*3/uL (ref 150–400)
Platelets: 159 10*3/uL (ref 150–400)
Platelets: 164 10*3/uL (ref 150–400)
RBC: 3.66 MIL/uL — ABNORMAL LOW (ref 3.87–5.11)
RBC: 3.33 MIL/uL — ABNORMAL LOW (ref 3.87–5.11)
RBC: 3.06 MIL/uL — ABNORMAL LOW (ref 3.87–5.11)
RDW: 12.8 % (ref 11.5–15.5)
RDW: 13 % (ref 11.5–15.5)
RDW: 12.9 % (ref 11.5–15.5)
WBC: 13 10*3/uL — ABNORMAL HIGH (ref 4.0–10.5)
WBC: 12.7 10*3/uL — ABNORMAL HIGH (ref 4.0–10.5)
WBC: 14.2 10*3/uL — ABNORMAL HIGH (ref 4.0–10.5)
nRBC: 0 % (ref 0.0–0.2)
nRBC: 0 % (ref 0.0–0.2)
nRBC: 0 % (ref 0.0–0.2)

## 2022-03-27 LAB — DIC (DISSEMINATED INTRAVASCULAR COAGULATION)PANEL
D-Dimer, Quant: 5.57 ug/mL-FEU — ABNORMAL HIGH (ref 0.00–0.50)
Fibrinogen: 527 mg/dL — ABNORMAL HIGH (ref 210–475)
INR: 1.2 (ref 0.8–1.2)
Platelets: 150 10*3/uL (ref 150–400)
Prothrombin Time: 15.3 seconds — ABNORMAL HIGH (ref 11.4–15.2)
Smear Review: NONE SEEN
aPTT: 29 seconds (ref 24–36)

## 2022-03-27 SURGERY — Surgical Case
Anesthesia: Epidural | Site: Abdomen

## 2022-03-27 MED ORDER — SODIUM CHLORIDE 0.9 % IR SOLN
Status: DC | PRN
  Administered 2022-03-27: 1

## 2022-03-27 MED ORDER — DEXAMETHASONE SODIUM PHOSPHATE 10 MG/ML IJ SOLN
INTRAMUSCULAR | Status: DC | PRN
  Administered 2022-03-27: 10 mg via INTRAVENOUS

## 2022-03-27 MED ORDER — SCOPOLAMINE 1 MG/3DAYS TD PT72
MEDICATED_PATCH | TRANSDERMAL | Status: AC
  Filled 2022-03-27: qty 1

## 2022-03-27 MED ORDER — MENTHOL 3 MG MT LOZG: 1.0000 | LOZENGE | OROMUCOSAL | Status: DC | PRN

## 2022-03-27 MED ORDER — FENTANYL CITRATE (PF) 100 MCG/2ML IJ SOLN: 25.0000 ug | INTRAMUSCULAR | Status: DC | PRN

## 2022-03-27 MED ORDER — FENTANYL CITRATE (PF) 100 MCG/2ML IJ SOLN
INTRAMUSCULAR | Status: DC | PRN
  Administered 2022-03-27: 100 ug via EPIDURAL

## 2022-03-27 MED ORDER — CEFAZOLIN SODIUM-DEXTROSE 2-4 GM/100ML-% IV SOLN
2.0000 g | INTRAVENOUS | Status: DC
  Filled 2022-03-27: qty 100

## 2022-03-27 MED ORDER — NALOXONE HCL 4 MG/10ML IJ SOLN: 1.0000 ug/kg/h | INTRAVENOUS | Status: DC | PRN

## 2022-03-27 MED ORDER — OXYCODONE HCL 5 MG PO TABS: 5.0000 mg | ORAL_TABLET | ORAL | Status: DC | PRN

## 2022-03-27 MED ORDER — PRENATAL MULTIVITAMIN CH
1.0000 | ORAL_TABLET | Freq: Every day | ORAL | Status: DC
  Filled 2022-03-27 (×2): qty 1

## 2022-03-27 MED ORDER — MISOPROSTOL 200 MCG PO TABS: 1000.0000 ug | ORAL_TABLET | Freq: Once | ORAL | Status: AC

## 2022-03-27 MED ORDER — MORPHINE SULFATE (PF) 2 MG/ML IV SOLN: 1.0000 mg | INTRAVENOUS | Status: DC | PRN

## 2022-03-27 MED ORDER — KETOROLAC TROMETHAMINE 30 MG/ML IJ SOLN
INTRAMUSCULAR | Status: AC
  Filled 2022-03-27: qty 1

## 2022-03-27 MED ORDER — KETOROLAC TROMETHAMINE 30 MG/ML IJ SOLN
30.0000 mg | Freq: Four times a day (QID) | INTRAMUSCULAR | Status: AC
  Administered 2022-03-27 – 2022-03-28 (×4): 30 mg via INTRAVENOUS
  Filled 2022-03-27 (×4): qty 1

## 2022-03-27 MED ORDER — ACETAMINOPHEN 10 MG/ML IV SOLN
INTRAVENOUS | Status: DC | PRN
  Administered 2022-03-27: 1000 mg via INTRAVENOUS

## 2022-03-27 MED ORDER — KETOROLAC TROMETHAMINE 30 MG/ML IJ SOLN
30.0000 mg | Freq: Four times a day (QID) | INTRAMUSCULAR | Status: DC | PRN
  Administered 2022-03-27: 30 mg via INTRAVENOUS

## 2022-03-27 MED ORDER — VITAMIN D 25 MCG (1000 UNIT) PO TABS
50.0000 ug | ORAL_TABLET | Freq: Every day | ORAL | Status: DC
  Administered 2022-03-28 – 2022-03-29 (×2): 50 ug via ORAL
  Filled 2022-03-27 (×3): qty 2

## 2022-03-27 MED ORDER — CARBOPROST TROMETHAMINE 250 MCG/ML IM SOLN
INTRAMUSCULAR | Status: AC
  Administered 2022-03-27: 250 ug via INTRAMUSCULAR
  Filled 2022-03-27: qty 1

## 2022-03-27 MED ORDER — DEXMEDETOMIDINE HCL IN NACL 80 MCG/20ML IV SOLN
INTRAVENOUS | Status: DC | PRN
  Administered 2022-03-27 (×2): 4 ug via BUCCAL
  Administered 2022-03-27: 8 ug via BUCCAL

## 2022-03-27 MED ORDER — MORPHINE SULFATE (PF) 0.5 MG/ML IJ SOLN
INTRAMUSCULAR | Status: DC | PRN
  Administered 2022-03-27: 3 mg via EPIDURAL

## 2022-03-27 MED ORDER — TRANEXAMIC ACID-NACL 1000-0.7 MG/100ML-% IV SOLN
INTRAVENOUS | Status: AC
  Administered 2022-03-27: 1000 mg via INTRAVENOUS
  Filled 2022-03-27: qty 100

## 2022-03-27 MED ORDER — DIPHENHYDRAMINE HCL 50 MG/ML IJ SOLN: 12.5000 mg | INTRAMUSCULAR | Status: DC | PRN

## 2022-03-27 MED ORDER — MISOPROSTOL 200 MCG PO TABS
ORAL_TABLET | ORAL | Status: AC
  Administered 2022-03-27: 1000 ug via RECTAL
  Filled 2022-03-27: qty 5

## 2022-03-27 MED ORDER — PROMETHAZINE HCL 25 MG/ML IJ SOLN: 6.2500 mg | INTRAMUSCULAR | Status: DC | PRN

## 2022-03-27 MED ORDER — BUPIVACAINE HCL (PF) 0.25 % IJ SOLN
INTRAMUSCULAR | Status: AC
  Filled 2022-03-27: qty 30

## 2022-03-27 MED ORDER — SIMETHICONE 80 MG PO CHEW: 80.0000 mg | CHEWABLE_TABLET | ORAL | Status: DC | PRN

## 2022-03-27 MED ORDER — DEXAMETHASONE SODIUM PHOSPHATE 10 MG/ML IJ SOLN
INTRAMUSCULAR | Status: AC
  Filled 2022-03-27: qty 1

## 2022-03-27 MED ORDER — SIMETHICONE 80 MG PO CHEW
80.0000 mg | CHEWABLE_TABLET | Freq: Three times a day (TID) | ORAL | Status: DC
  Administered 2022-03-29 (×2): 80 mg via ORAL
  Filled 2022-03-27 (×4): qty 1

## 2022-03-27 MED ORDER — COCONUT OIL OIL
1.0000 | TOPICAL_OIL | Status: DC | PRN
  Administered 2022-03-28: 1 via TOPICAL

## 2022-03-27 MED ORDER — SENNOSIDES-DOCUSATE SODIUM 8.6-50 MG PO TABS
2.0000 | ORAL_TABLET | Freq: Every day | ORAL | Status: DC
  Filled 2022-03-27 (×2): qty 2

## 2022-03-27 MED ORDER — TRANEXAMIC ACID 1000 MG/10ML IV SOLN
INTRAVENOUS | Status: DC | PRN
  Administered 2022-03-27: 1000 mg via INTRAVENOUS

## 2022-03-27 MED ORDER — FENTANYL CITRATE (PF) 100 MCG/2ML IJ SOLN
INTRAMUSCULAR | Status: AC
  Filled 2022-03-27: qty 2

## 2022-03-27 MED ORDER — SODIUM CHLORIDE 0.9 % IV SOLN
3.0000 g | Freq: Once | INTRAVENOUS | Status: AC
  Administered 2022-03-27: 3 g via INTRAVENOUS
  Filled 2022-03-27: qty 8

## 2022-03-27 MED ORDER — ONDANSETRON HCL 4 MG/2ML IJ SOLN
4.0000 mg | Freq: Three times a day (TID) | INTRAMUSCULAR | Status: DC | PRN
  Administered 2022-03-27: 4 mg via INTRAVENOUS
  Filled 2022-03-27: qty 2

## 2022-03-27 MED ORDER — CEFAZOLIN SODIUM-DEXTROSE 2-3 GM-%(50ML) IV SOLR
INTRAVENOUS | Status: DC | PRN
  Administered 2022-03-27: 2 g via INTRAVENOUS

## 2022-03-27 MED ORDER — SODIUM CHLORIDE 0.9 % IV SOLN
500.0000 mg | INTRAVENOUS | Status: AC
  Administered 2022-03-27: 250 mg via INTRAVENOUS

## 2022-03-27 MED ORDER — ONDANSETRON HCL 4 MG/2ML IJ SOLN
INTRAMUSCULAR | Status: AC
  Filled 2022-03-27: qty 2

## 2022-03-27 MED ORDER — CARBOPROST TROMETHAMINE 250 MCG/ML IM SOLN: 250.0000 ug | Freq: Once | INTRAMUSCULAR | Status: AC

## 2022-03-27 MED ORDER — SCOPOLAMINE 1 MG/3DAYS TD PT72
1.0000 | MEDICATED_PATCH | Freq: Once | TRANSDERMAL | Status: DC
  Administered 2022-03-27: 1.5 mg via TRANSDERMAL

## 2022-03-27 MED ORDER — BUPIVACAINE LIPOSOME 1.3 % IJ SUSP
INTRAMUSCULAR | Status: DC | PRN
  Administered 2022-03-27: 20 mL

## 2022-03-27 MED ORDER — MORPHINE SULFATE (PF) 0.5 MG/ML IJ SOLN
INTRAMUSCULAR | Status: AC
  Filled 2022-03-27: qty 10

## 2022-03-27 MED ORDER — OXYTOCIN-SODIUM CHLORIDE 30-0.9 UT/500ML-% IV SOLN
INTRAVENOUS | Status: AC
  Filled 2022-03-27: qty 500

## 2022-03-27 MED ORDER — ACETAMINOPHEN 10 MG/ML IV SOLN
INTRAVENOUS | Status: AC
  Filled 2022-03-27: qty 100

## 2022-03-27 MED ORDER — LIDOCAINE-EPINEPHRINE (PF) 2 %-1:200000 IJ SOLN
INTRAMUSCULAR | Status: DC | PRN
  Administered 2022-03-27 (×3): 5 mL via EPIDURAL

## 2022-03-27 MED ORDER — KETOROLAC TROMETHAMINE 30 MG/ML IJ SOLN: 30.0000 mg | Freq: Four times a day (QID) | INTRAMUSCULAR | Status: DC | PRN

## 2022-03-27 MED ORDER — BUPIVACAINE LIPOSOME 1.3 % IJ SUSP: 20.0000 mL | Freq: Once | INTRAMUSCULAR | Status: DC

## 2022-03-27 MED ORDER — DIPHENHYDRAMINE HCL 25 MG PO CAPS: 25.0000 mg | ORAL_CAPSULE | ORAL | Status: DC | PRN

## 2022-03-27 MED ORDER — LIDOCAINE-EPINEPHRINE (PF) 2 %-1:200000 IJ SOLN
INTRAMUSCULAR | Status: AC
  Filled 2022-03-27: qty 20

## 2022-03-27 MED ORDER — DIBUCAINE (PERIANAL) 1 % EX OINT: 1.0000 | TOPICAL_OINTMENT | CUTANEOUS | Status: DC | PRN

## 2022-03-27 MED ORDER — TRANEXAMIC ACID-NACL 1000-0.7 MG/100ML-% IV SOLN: 1000.0000 mg | Freq: Once | INTRAVENOUS | Status: AC

## 2022-03-27 MED ORDER — IBUPROFEN 600 MG PO TABS
600.0000 mg | ORAL_TABLET | Freq: Four times a day (QID) | ORAL | Status: DC
  Administered 2022-03-28 – 2022-03-29 (×5): 600 mg via ORAL
  Filled 2022-03-27 (×5): qty 1

## 2022-03-27 MED ORDER — TRANEXAMIC ACID-NACL 1000-0.7 MG/100ML-% IV SOLN: 1000.0000 mg | INTRAVENOUS | Status: DC

## 2022-03-27 MED ORDER — DEXMEDETOMIDINE HCL IN NACL 80 MCG/20ML IV SOLN
INTRAVENOUS | Status: AC
  Filled 2022-03-27: qty 20

## 2022-03-27 MED ORDER — MEPERIDINE HCL 25 MG/ML IJ SOLN: 6.2500 mg | INTRAMUSCULAR | Status: DC | PRN

## 2022-03-27 MED ORDER — SODIUM CHLORIDE (PF) 0.9 % IJ SOLN
INTRAMUSCULAR | Status: AC
  Filled 2022-03-27: qty 50

## 2022-03-27 MED ORDER — BUPIVACAINE LIPOSOME 1.3 % IJ SUSP
INTRAMUSCULAR | Status: AC
  Filled 2022-03-27: qty 20

## 2022-03-27 MED ORDER — SODIUM CHLORIDE 0.9% FLUSH: 3.0000 mL | INTRAVENOUS | Status: DC | PRN

## 2022-03-27 MED ORDER — DIPHENOXYLATE-ATROPINE 2.5-0.025 MG PO TABS
1.0000 | ORAL_TABLET | Freq: Once | ORAL | Status: AC
  Administered 2022-03-27: 1 via ORAL
  Filled 2022-03-27: qty 1

## 2022-03-27 MED ORDER — NALOXONE HCL 0.4 MG/ML IJ SOLN: 0.4000 mg | INTRAMUSCULAR | Status: DC | PRN

## 2022-03-27 MED ORDER — ACETAMINOPHEN 500 MG PO TABS: 1000.0000 mg | ORAL_TABLET | Freq: Four times a day (QID) | ORAL | Status: DC

## 2022-03-27 MED ORDER — ZOLPIDEM TARTRATE 5 MG PO TABS: 5.0000 mg | ORAL_TABLET | Freq: Every evening | ORAL | Status: DC | PRN

## 2022-03-27 MED ORDER — STERILE WATER FOR IRRIGATION IR SOLN
Status: DC | PRN
  Administered 2022-03-27: 1000 mL

## 2022-03-27 MED ORDER — LACTATED RINGERS IV SOLN: INTRAVENOUS | Status: DC

## 2022-03-27 MED ORDER — ACETAMINOPHEN 500 MG PO TABS
1000.0000 mg | ORAL_TABLET | Freq: Four times a day (QID) | ORAL | Status: DC
  Administered 2022-03-28 – 2022-03-29 (×3): 1000 mg via ORAL
  Filled 2022-03-27 (×6): qty 2

## 2022-03-27 MED ORDER — WITCH HAZEL-GLYCERIN EX PADS: 1.0000 | MEDICATED_PAD | CUTANEOUS | Status: DC | PRN

## 2022-03-27 MED ORDER — OXYTOCIN-SODIUM CHLORIDE 30-0.9 UT/500ML-% IV SOLN: 2.5000 [IU]/h | INTRAVENOUS | Status: AC

## 2022-03-27 MED ORDER — DIPHENHYDRAMINE HCL 25 MG PO CAPS: 25.0000 mg | ORAL_CAPSULE | Freq: Four times a day (QID) | ORAL | Status: DC | PRN

## 2022-03-27 SURGICAL SUPPLY — 32 items
BENZOIN TINCTURE PRP APPL 2/3 (GAUZE/BANDAGES/DRESSINGS) IMPLANT
CHLORAPREP W/TINT 26 (MISCELLANEOUS) ×2 IMPLANT
CLAMP UMBILICAL CORD (MISCELLANEOUS) ×1 IMPLANT
CLOTH BEACON ORANGE TIMEOUT ST (SAFETY) ×1 IMPLANT
DRSG OPSITE POSTOP 4X10 (GAUZE/BANDAGES/DRESSINGS) ×1 IMPLANT
ELECT REM PT RETURN 9FT ADLT (ELECTROSURGICAL) ×1
ELECTRODE REM PT RTRN 9FT ADLT (ELECTROSURGICAL) ×1 IMPLANT
EXTRACTOR VACUUM BELL STYLE (SUCTIONS) IMPLANT
GAUZE SPONGE 4X4 12PLY STRL LF (GAUZE/BANDAGES/DRESSINGS) IMPLANT
GLOVE BIOGEL PI IND STRL 6.5 (GLOVE) ×2 IMPLANT
GLOVE ECLIPSE 6.5 STRL STRAW (GLOVE) ×2 IMPLANT
GOWN STRL REUS W/TWL LRG LVL3 (GOWN DISPOSABLE) ×3 IMPLANT
NEEDLE HYPO 22GX1.5 SAFETY (NEEDLE) ×1 IMPLANT
NS IRRIG 1000ML POUR BTL (IV SOLUTION) ×1 IMPLANT
PACK C SECTION WH (CUSTOM PROCEDURE TRAY) ×1 IMPLANT
PAD ABD 7.5X8 STRL (GAUZE/BANDAGES/DRESSINGS) IMPLANT
PAD OB MATERNITY 4.3X12.25 (PERSONAL CARE ITEMS) ×1 IMPLANT
RETAINER VISCERAL (MISCELLANEOUS) IMPLANT
SPONGE T-LAP 18X18 ~~LOC~~+RFID (SPONGE) IMPLANT
STRIP CLOSURE SKIN 1/2X4 (GAUZE/BANDAGES/DRESSINGS) IMPLANT
SUT MNCRL 0 VIOLET CTX 36 (SUTURE) IMPLANT
SUT MNCRL+ AB 3-0 CT1 36 (SUTURE) IMPLANT
SUT MONOCRYL 0 CTX 36 (SUTURE) ×2
SUT MONOCRYL AB 3-0 CT1 36IN (SUTURE) ×1
SUT PDS AB 0 CTX 36 PDP370T (SUTURE) IMPLANT
SUT VIC AB 2-0 CT1 27 (SUTURE) ×1
SUT VIC AB 2-0 CT1 TAPERPNT 27 (SUTURE) IMPLANT
SUT VIC AB 4-0 KS 27 (SUTURE) IMPLANT
SYR 30ML LL (SYRINGE) IMPLANT
SYR CONTROL 10ML LL (SYRINGE) ×1 IMPLANT
TOWEL OR 17X24 6PK STRL BLUE (TOWEL DISPOSABLE) ×1 IMPLANT
WATER STERILE IRR 1000ML POUR (IV SOLUTION) ×1 IMPLANT

## 2022-03-27 NOTE — Op Note (Signed)
Pre Op Dx:   1. Single live IUP at [redacted]w[redacted]d 2. Arrest of dilation 3. Category II Fetal Heart Tracing 4. Cholestasis of pregnancy  Post Op Dx:  Same as pre-operative diagnoses  Procedure:  Cesarean Section  Surgeon:  Dr. Wellington Hampshire. Delora Fuel Assistants:  Dr. Naaman Plummer [facility] Fellow) Anesthesia:  Epidural  EBL:  1113cc  IVF:  2000cc UOP:  100cc blood-tinged  Drains:  Foley catheter  Specimen removed:  Placenta - patient desires to take home Device(s) implanted:  None Case Type:  Clean-contaminated Findings: Normal-appearing uterus, bilateral fallopian tubes, and ovaries. Filmy adhesion over left fallopian tube. Blood-tinged urine noted prior to cesarean section which began to clear. Clear amniotic fluid. Fetus in direct occiput posterior position. APGAR: 8/9. Infant weight: Pending. Complications: None Indications:  26 y.o. G2P0010 at [redacted]w[redacted]d undergoing induction of labor for cholestasis of pregnancy with arrest of descent and intermittent Category II fetal heart tracing.  Procedure:  After informed consent was obtained, the patient was brought to the operating room.  Following re-dosing of epidural, the patient was positioned in dorsal supine position with a leftward tilt and was prepped and draped in sterile fashion.  A preoperative time-out was performed.  The abdomen was entered in layers through a pfannenstiel incision and a retractor was placed.  A low transverse hysterotomy was created sharply to the level of the membranes, then extended bluntly.  The fetus was delivered from cephalic presentation (OP) onto the field.  Bulb suctioning was performed.  The cord was doubly clamped and cut after an approximately 45 second pause.  The newborn was passed to the warmer.  The placenta was delivered.  The uterus was swept free of clots and debris and closed in a running locked fashion with 0-Monocryl. A second imbricating layer was used to close the uterus using 0-Monocryl.  Hemostasis was  verified.  The abdomen was irrigated with warmed saline and cleared of clots.  The peritoneum was closed in a running fashion with 2-0 Vicryl.  Subfascial spaces were inspected and hemostasis assured.  The fascia was closed in a running fashion with 0-PDS. Exparel placed beneath and above the fascia and in the subcutaneous tissues.  The subcutaneous tissues were irrigated and hemostasis assured.  The subcutaneous tissues were closed with 3-0 Monocryl.  The skin was closed with 4-0 Vicryl.  A sterile bandage was applied.  The patient was transferred to PACU.  All needle, sponge, and instrument counts were correct at the end of the case.     Disposition:  PACU  Comments: I performed the procedure and the assistant was needed due to the complexity of the anatomy. An experienced assistant was required given the standard of surgical care given the complexity of the case.  This assistant was needed for exposure, dissection, suctioning, retraction, instrument exchange, assisting with delivery with administration of fundal pressure, and for overall help during the procedure.    Drema Dallas, DO

## 2022-03-27 NOTE — Lactation Note (Signed)
This note was copied from a baby's chart.  NICU Lactation Consultation Note  Patient Name: Boy Deazia Lampi [id number] Date: 03/27/2022 Age:28 hours   Subjective Reason for consult: Initial assessment; NICU baby; Primapara; 1st time breastfeeding; Term  LC in to assist with P1 Mom Lelan Pons) with first pumping.  24 mm flanges used. Meka briefly demonstrated breast massage and hand expression.  Christinamarie very tired, but voiced she wanted to initiate pumping.    Talked about the importance of frequent pumping when awake.Lelan Pons is very motivated and has a Zomee pump at home.  She also has [facility], faxed referral to [facility] office.  Keshana has a dear friend at her bedside that is hand expressing (manual pump provided) breast milk and placing it in bottles in the refrigerator.  NICU RN aware of this.  Keshawn knows she will need to sign a consent form.  Objective Infant data: Mother's Current Feeding Choice: Breast Milk and Donor Milk  Infant feeding assessment Scale for Readiness: 2  Maternal data: G2P1011  C-Section, Low Transverse Significant Breast History:: + breast changes  Current breast feeding challenges:: PPH  Does the patient have breastfeeding experience prior to this delivery?: No  Pumping frequency: Encouraged to pump every 2-3 hrs when awake Pumped volume: 0 mL Flange Size: 24  Risk factor for low milk supply:: Infant separation, PPH after C/S 2396 cc EBL total   WIC Program: Yes WIC Referral Sent?: Yes Pump: Personal, DEBP (Zomee DEBP)  Assessment Infant:  Maternal: Milk volume: Normal   Intervention/Plan Interventions: Breast feeding basics reviewed; Skin to skin; Breast massage; Hand express; DEBP; Hand pump; LC Services brochure  Tools: Pump; Flanges Pump Education: Setup, frequency, and cleaning; Milk Storage 2 bins provided for washing and drying.    Plan: Consult Status: Follow-up  NICU Follow-up type: New admission follow up    Broadus John 03/27/2022, 10:18 AM

## 2022-03-27 NOTE — Progress Notes (Addendum)
Subjective: Postpartum Day 0: Cesarean Delivery Patient reports tolerating PO and + flatus. Pain well controlled with PO analgesia. Patient up ambulating in the room.  Patient s/p  postpartum hemorrhage with total QBL 2536. S/p TXA intra-op and Hemabate, Cytotec PR, TXA in the postpartum course during hemorrhage. S/p manual uterine evacuation of blood clots. Jada placed after blood clots cleared at 0630 with minimal output noted on suction. Jada removed at 0930. Bleeding has been stable since and uterine firm and below umbilicus. S/p IV Unasyn and Lovenox held. SCDs in place. Hgb 8.3, patient asymptomatic. Will repeat CBC in AM. Denies light headedness, dizziness, syncope, SOB, CP, HA.   Female infant in NICU and patient does desire circumcision for female infant when cleared. Patient is pumping and has visited infant in NICU since delivery.   Met criteria for GHTN intrapartum. Patient doing well at this time without complaints. She denies headache, blurry vision, chest pain, shortness of breath or RUQ pain. BP normotensive since PPH this morning. Will continue to monitor closely.  Objective: Vital signs in last 24 hours: Temp:  [97.6 F (36.4 C)-98.4 F (36.9 C)] 97.7 F (36.5 C) (02/01 0745) Pulse Rate:  [70-104] 84 (02/01 0745) Resp:  [0-21] 17 (02/01 0745) BP: (75-151)/(56-107) 130/61 (02/01 0745) SpO2:  [96 %-100 %] 98 % (02/01 0745)  Physical Exam:  General: alert, cooperative, and no distress Lochia: appropriate Uterine Fundus: firm Incision: dressing 25% saturated with blood, appears stable. DVT Evaluation: No evidence of DVT seen on physical exam.  Recent Labs    03/27/22 0641 03/27/22 1211  HGB 9.2* 8.3*  HCT 27.5* 25.2*    Assessment/Plan: Status post Cesarean section. Doing well postoperatively.  Continue current care. Pumping. Postpartum hemorrhage - stable, repeat CBC in AM GHTN - normotensive at present, continue to monitor closely Female infant in NICU - desires  circumcision SCDs while in bed  Josefine Class, MD 03/27/2022, 1:49 PM

## 2022-03-27 NOTE — Progress Notes (Signed)
OB Progress Note  In to reassess. Patient states she has made her decision to proceed with cesarean delivery. She is feeling back some back pain while sitting up in throne position. All additional questions answered. FHT currently Category I. Cervix 9cm with thick anterior lip and significant caput to +1 station.   Plan to proceed with cesarean delivery. OR Agricultural consultant, L&D staff, and anesthesia made aware. Ancef 2g and Azithromycin 500mg  on call to OR.  Drema Dallas, DO

## 2022-03-27 NOTE — Anesthesia Postprocedure Evaluation (Signed)
Anesthesia Post Note  Patient: Diana Higgins  Procedure(s) Performed: CESAREAN SECTION (Abdomen)     Patient location during evaluation: PACU Anesthesia Type: Epidural Level of consciousness: awake, awake and alert and oriented Pain management: pain level controlled Vital Signs Assessment: post-procedure vital signs reviewed and stable Respiratory status: spontaneous breathing, nonlabored ventilation and respiratory function stable Cardiovascular status: stable Postop Assessment: no headache, no backache, epidural receding, patient able to bend at knees and no signs of nausea or vomiting Anesthetic complications: no   No notable events documented.  Last Vitals:  Vitals:   03/27/22 0835 03/27/22 1505  BP:  136/69  Pulse:  89  Resp:  18  Temp:  36.8 C  SpO2: 99% 98%    Last Pain:  Vitals:   03/27/22 2019  TempSrc:   PainSc: 0-No pain                 Santa Lighter

## 2022-03-27 NOTE — Progress Notes (Signed)
In to evaluate patient for postpartum hemorrhage. Per primary RN, multiple clots have been passed on each fundal rub. Patient received 1g TXA immediately post-operatively in the OR. She received Hemabate 250mcg at 0538 and Cytotec 1021mcg per rectum. Upon arrival to room, patient is sitting up in bed and appears comfortable. Reports feeling hot and feels trickling of blood and/or clots. Additional help (Dr. Mikel Cella) was called for to assist with placement of Jada.  On exam, fundus at U+2 with minimal blood on fundal rub. Bimanual exam performed with extraction of multiple clots from the lower uterine segment and the uterus involuted to the umbilicus. Uterine fundus firm. On bedside ultrasound, uterine stripe appeared thin. Jada device placed under ultrasound guidance - balloon filled to 60cc and pressure set to 82mmHg continuously. Minimal blood noted through the tubing. Patient tolerated this very well. An additional 1g TXA was given. Suspect bleeding secondary to uterine atony. Due to history of gestational HTN, Methergine avoided at this time. Vital signs reviewed and stable as below. Starting Hgb 12 pre-delivery. STAT CBC and DIC panel ordered. Second IV started by primary RN.   Vitals:   03/27/22 0525 03/27/22 0602 03/27/22 0625 03/27/22 0632  BP: 129/69 137/78 137/81 (!) 134/107  Pulse: (!) 102 87 (!) 104 92  Resp: 19  18   Temp:      TempSrc:      SpO2: 97% 99% 99% 96%  Weight:      Height:          Latest Ref Rng & Units 03/27/2022    5:17 AM 03/25/2022   11:03 PM 03/25/2022    8:40 AM  CBC  WBC 4.0 - 10.5 K/uL 13.0  8.1  4.6   Hemoglobin 12.0 - 15.0 g/dL 10.1  12.0  12.0   Hematocrit 36.0 - 46.0 % 30.4  36.5  37.4   Platelets 150 - 400 K/uL 155  179  168     Total QBL post-delivery, including fundal rub: 1213cc Total QBL at the time of this encounter, including delivery: [place], DO

## 2022-03-27 NOTE — Transfer of Care (Signed)
Immediate Anesthesia Transfer of Care Note  Patient: Diana Higgins  Procedure(s) Performed: CESAREAN SECTION (Abdomen)  Patient Location: PACU  Anesthesia Type:Epidural  Level of Consciousness: awake, alert , oriented, and patient cooperative  Airway & Oxygen Therapy: Patient Spontanous Breathing  Post-op Assessment: Report given to RN and Post -op Vital signs reviewed and stable  Post vital signs: Reviewed and stable  Last Vitals:  Vitals Value Taken Time  BP 149/87 03/27/22 0315  Temp 36.4 C 03/27/22 0306  Pulse 87 03/27/22 0319  Resp 17 03/27/22 0319  SpO2 99 % 03/27/22 0319  Vitals shown include unvalidated device data.  Last Pain:  Vitals:   03/27/22 0315  TempSrc:   PainSc: 0-No pain         Complications: No notable events documented.

## 2022-03-28 DIAGNOSIS — D62 Acute posthemorrhagic anemia: Secondary | ICD-10-CM

## 2022-03-28 HISTORY — DX: Acute posthemorrhagic anemia: D62

## 2022-03-28 LAB — CBC
HCT: 18.8 % — ABNORMAL LOW (ref 36.0–46.0)
Hemoglobin: 6.2 g/dL — CL (ref 12.0–15.0)
MCH: 27.6 pg (ref 26.0–34.0)
MCHC: 33 g/dL (ref 30.0–36.0)
MCV: 83.6 fL (ref 80.0–100.0)
Platelets: 125 10*3/uL — ABNORMAL LOW (ref 150–400)
RBC: 2.25 MIL/uL — ABNORMAL LOW (ref 3.87–5.11)
RDW: 13 % (ref 11.5–15.5)
WBC: 8 10*3/uL (ref 4.0–10.5)
nRBC: 0 % (ref 0.0–0.2)

## 2022-03-28 MED ORDER — SODIUM CHLORIDE 0.9 % IV SOLN
500.0000 mg | Freq: Once | INTRAVENOUS | Status: AC
  Administered 2022-03-28: 500 mg via INTRAVENOUS
  Filled 2022-03-28: qty 500

## 2022-03-28 NOTE — Progress Notes (Signed)
Subjective: POD# 1 Information for the patient's newborn:  Nitza, Schmid [[id number]]  female   Desires circumcision  Reports feeling "very good", denies fatigue, lightheadedness, and dizziness. Has been safely ambulating in halls and to NICU w/o complaints Feeding: breast, pumping, and donor milk Reports tolerating PO and denies N/V, foley removed, ambulating and urinating w/o difficulty  Pain controlled with  PO meds Denies HA/SOB/dizziness  Flatus Yes Vaginal bleeding is normal, no clots     Objective:  VS:  Vitals:   03/27/22 2359 03/28/22 0313 03/28/22 0928 03/28/22 1305  BP: (!) 145/70 128/65 139/77 135/73  Pulse: (!) 115 (!) 119 (!) 107 91  Resp: 18 16 18 17   Temp: 98.7 F (37.1 C) 98.8 F (37.1 C) 98.6 F (37 C) 98.2 F (36.8 C)  TempSrc: Oral Oral Oral Oral  SpO2: 98% 98% 98% 100%  Weight:      Height:        Intake/Output Summary (Last 24 hours) at 03/28/2022 1325 Last data filed at 03/28/2022 0500 Gross per 24 hour  Intake 1294.26 ml  Output 300 ml  Net 994.26 ml     Recent Labs    03/27/22 1211 03/28/22 0518  WBC 14.2* 8.0  HGB 8.3* 6.2*  HCT 25.2* 18.8*  PLT 164 125*    Blood type: --/--/O POS (01/30 0840) Rubella: Immune (07/11 0000)    Physical Exam:  General: alert, cooperative, and no distress CV: Regular rate and rhythm or without murmur or extra heart sounds Resp: clear, unlabored Abdomen: soft, non-tender, non distended Incision:  honeycomb dressing C/D/I Perineum:  Uterine Fundus: firm, below umbilicus, nontender Lochia:  scant, no clots Ext:  neg for pain, tenderness, and cords, +1 non-pitting edema   Assessment/Plan: 27 y.o.   POD# 1. [id number]                  Failed IOL for ICP   Primary C/S for arrest of dilation  Active Problems:   Status post primary low transverse cesarean section   Gestational hypertension w/o significant proteinuria in 3rd trimester   PPH (postpartum hemorrhage)   Acute blood loss anemia (ABLA)        -asymptomatic hgb 6.2, discussed blood transfusion and IV iron.        -start IV Venofer 500 mg IV x1 dose  Continue routine post-op PP care          Advance diet as tolerated May continue to visit newborn in NICU  Lactation support Anticipate D/C on POD day 2 or Chance, DNP, CNM 03/28/2022, 1:25 PM

## 2022-03-28 NOTE — Progress Notes (Signed)
Patient screened out for psychosocial assessment since none of the following apply:  Psychosocial stressors documented in mother or baby's chart  Gestation less than 32 weeks  Code at delivery   Infant with anomalies Please contact the Clinical Social Worker if specific needs arise, by MOB's request, or if MOB scores greater than 9/yes to question 10 on Edinburgh Postpartum Depression Screen.  Sherilynn Dieu, LCSW Clinical Social Worker [facility] Cell#: [phone]     

## 2022-03-28 NOTE — Lactation Note (Addendum)
This note was copied from a baby's chart. Lactation Consultation Note  Patient Name: Diana Higgins [id number] Date: 03/28/2022 Reason for consult: Follow-up assessment;NICU baby;1st time breastfeeding;Primapara;Term;RN request;Mother's request Age:28 hours  Mother with PPH EBL 2396 cc requested assistance due to discomfort with pumping and concern regarding volume pumped.  Mother stated she expressed 8 ml with her first pumping session and has not produced that volume since.  Education was provided regarding initial bolus of colostrum after birth and how breastmilk volume transitions.  Mother also stated she felt her nipples were not gliding in flanges and felt tight.  Fitted mother with 27 mm flanges which mother stated felt more comfortable.  Provided mother with coconut oil and the option of trying the 24 mm flanges at another pumping session to evaluate her most comfortable fit.  Mother pumped drops approx. 1 ml and wanted to pumped an additional 15 min.  Advised her not to pump more than 30 min.  Mother will call for further assistance as needed.   Maternal Data Has patient been taught Hand Expression?: Yes  Feeding Mother's Current Feeding Choice: Breast Milk and Donor Milk   Lactation Tools Discussed/Used Tools: Pump;Flanges;Coconut oil Flange Size: 27 Breast pump type: Double-Electric Breast Pump;Manual Reason for Pumping: Infant separation Pumping frequency: 8 times per day  Interventions Interventions: Hand pump;Education  Discharge Pump: Personal;DEBP;Hands Free  Consult Status Consult Status: NICU follow-up    Carlye Grippe 03/28/2022, 10:29 AM

## 2022-03-28 NOTE — Plan of Care (Signed)
  Problem: Education: Goal: Knowledge of General Education information will improve Description: Including pain rating scale, medication(s)/side effects and non-pharmacologic comfort measures Outcome: Completed/Met   Problem: Activity: Goal: Risk for activity intolerance will decrease Outcome: Completed/Met   Problem: Nutrition: Goal: Adequate nutrition will be maintained Outcome: Completed/Met   Problem: Coping: Goal: Level of anxiety will decrease Outcome: Completed/Met   Problem: Elimination: Goal: Will not experience complications related to bowel motility Outcome: Completed/Met Goal: Will not experience complications related to urinary retention Outcome: Completed/Met   Problem: Education: Goal: Knowledge of the prescribed therapeutic regimen will improve Outcome: Completed/Met Goal: Understanding of sexual limitations or changes related to disease process or condition will improve Outcome: Completed/Met   Problem: Education: Goal: Knowledge of condition will improve Outcome: Completed/Met   Problem: Activity: Goal: Will verbalize the importance of balancing activity with adequate rest periods Outcome: Completed/Met Goal: Ability to tolerate increased activity will improve Outcome: Completed/Met   Problem: Coping: Goal: Ability to identify and utilize available resources and services will improve Outcome: Completed/Met   Problem: Role Relationship: Goal: Ability to demonstrate positive interaction with newborn will improve Outcome: Completed/Met

## 2022-03-29 MED ORDER — POLYSACCHARIDE IRON COMPLEX 150 MG PO CAPS: 150.0000 mg | ORAL_CAPSULE | Freq: Every day | ORAL | 0 refills | Status: DC

## 2022-03-29 MED ORDER — IBUPROFEN 600 MG PO TABS: 600.0000 mg | ORAL_TABLET | Freq: Four times a day (QID) | ORAL | 0 refills | Status: DC

## 2022-03-29 MED ORDER — OXYCODONE HCL 5 MG PO TABS: 5.0000 mg | ORAL_TABLET | ORAL | 0 refills | Status: DC | PRN

## 2022-03-29 MED ORDER — ORAL CARE MOUTH RINSE: 15.0000 mL | OROMUCOSAL | Status: DC | PRN

## 2022-03-29 MED ORDER — POLYSACCHARIDE IRON COMPLEX 150 MG PO CAPS: 150.0000 mg | ORAL_CAPSULE | Freq: Every day | ORAL | Status: DC

## 2022-03-29 NOTE — Discharge Summary (Signed)
Postpartum Discharge Summary   Patient Name: Diana Higgins DOB: [birth year] MRN: [id number]  Date of admission: 03/25/2022 Delivery date:03/27/2022  Delivering provider: Drema Dallas  Date of discharge: 03/29/2022  Admitting diagnosis: Encounter for induction of labor [Z34.90] Cholestasis of Pregnancy.  Intrauterine pregnancy: [redacted]w[redacted]d     Secondary diagnosis:  Principal Problem:   Encounter for induction of labor Active Problems:   Arrest of dilation, delivered, current hospitalization   Status post primary low transverse cesarean section   Gestational hypertension w/o significant proteinuria in 3rd trimester   PPH (postpartum hemorrhage)   Acute blood loss anemia (ABLA)  Additional problems: None.    Discharge diagnosis: Term Pregnancy Delivered, Anemia, and PPH                                              Post partum procedures: Jada Balloon placement and removal.  Complications: BPZWCHENID>7824MP (total EBL 2536 cc).   Hospital course: Induction of Labor With Cesarean Section   27 y.o. yo G2P1011 at [redacted]w[redacted]d was admitted to the hospital 03/25/2022 for induction of labor. Patient had a labor course significant for arrest of descent and category II fetal heart tracing.  The patient went for cesarean section due to Arrest of Descent and Non-Reassuring FHR. Delivery details are as follows: Membrane Rupture Time/Date: 5:45 PM ,03/25/2022   Delivery Method:C-Section, Low Transverse  Details of operation can be found in separate operative Note.  Patient had a postpartum course complicated by postpartum hemorrhage. She had anemia with Hgb of 6.2.  She was asymptomatic and did not desires blood transfusion, however she accepted IV iron transfusion.  She is ambulating, tolerating a regular diet, passing flatus, and urinating well.  Patient is discharged home in stable condition on 03/29/22.      Newborn Data: Birth date:03/27/2022  Birth time:1:59 AM  Gender:Female  Living status:Living  Apgars:8  ,9  Weight:3170 g                                Magnesium Sulfate received: No Transfusion:Yes- IV iron.   Physical exam  Vitals:   03/28/22 2139 03/28/22 2305 03/29/22 0207 03/29/22 0800  BP: (!) 146/78 137/75 139/79 128/66  Pulse: (!) 114 (!) 113 100 92  Resp: 20 18 19 18   Temp: 98.1 F (36.7 C) 97.9 F (36.6 C) 98.1 F (36.7 C) 98.1 F (36.7 C)  TempSrc: Oral Oral Oral Oral  SpO2: 99% 100% 100% 99%  Weight:      Height:       General: alert, cooperative, and no distress Lochia: appropriate Uterine Fundus: firm Incision: Dressing is clean, dry, and intact DVT Evaluation: No evidence of DVT seen on physical exam. Calf/Ankle edema is present Labs: Lab Results  Component Value Date   WBC 8.0 03/28/2022   HGB 6.2 (LL) 03/28/2022   HCT 18.8 (L) 03/28/2022   MCV 83.6 03/28/2022   PLT 125 (L) 03/28/2022      Latest Ref Rng & Units 03/25/2022    8:40 AM  CMP  Glucose 70 - 99 mg/dL 86   BUN 6 - 20 mg/dL 15   Creatinine 0.44 - 1.00 mg/dL 0.87   Sodium 135 - 145 mmol/L 135   Potassium 3.5 - 5.1 mmol/L 3.9   Chloride 98 - 111 mmol/L 104  CO2 22 - 32 mmol/L 23   Calcium 8.9 - 10.3 mg/dL 9.5   Total Protein 6.5 - 8.1 g/dL 6.7   Total Bilirubin 0.3 - 1.2 mg/dL 0.4   Alkaline Phos 38 - 126 U/L 170   AST 15 - 41 U/L 25   ALT 0 - 44 U/L 14    Edinburgh Score:    03/28/2022    5:30 PM  Edinburgh Postnatal Depression Scale Screening Tool  I have been able to laugh and see the funny side of things. 0  I have looked forward with enjoyment to things. 0  I have blamed myself unnecessarily when things went wrong. 2  I have been anxious or worried for no good reason. 0  I have felt scared or panicky for no good reason. 0  Things have been getting on top of me. 1  I have been so unhappy that I have had difficulty sleeping. 0  I have felt sad or miserable. 2  I have been so unhappy that I have been crying. 2  The thought of harming myself has occurred to me. 0  Edinburgh  Postnatal Depression Scale Total 7   After visit meds:  Allergies as of 03/29/2022   No Known Allergies      Medication List     STOP taking these medications    ursodiol 300 MG capsule Commonly known as: ACTIGALL       TAKE these medications    ibuprofen 600 MG tablet Commonly known as: ADVIL Take 1 tablet (600 mg total) by mouth every 6 (six) hours.   iron polysaccharides 150 MG capsule Commonly known as: NIFEREX Take 1 capsule (150 mg total) by mouth daily. Start taking on: March 30, 2022   oxyCODONE 5 MG immediate release tablet Commonly known as: Oxy IR/ROXICODONE Take 1-2 tablets (5-10 mg total) by mouth every 4 (four) hours as needed for moderate pain.   prenatal multivitamin Tabs tablet Take 1 tablet by mouth daily at 12 noon.   PROTONIX PO Take 1 tablet by mouth daily.   VITAMIN D-3 PO Take 2 capsules by mouth daily.       Discharge home in stable condition Infant Feeding: Bottle and Breast Infant Disposition:NICU Discharge instruction: per After Visit Summary and Postpartum booklet. Activity: Advance as tolerated. Pelvic rest for 6 weeks.  Diet: routine diet Future Appointments:No future appointments. Follow up Visit:  Follow-up [place]. Schedule an appointment as soon as possible for a visit today.   Why: Incision and mood check. Contact information: [address] [place] [address] [phone]        [facility]. Schedule an appointment as soon as possible for a visit in 6 week(s).   Why: Postpartum check. Contact information: [address]  [address] [phone]               Anticipated Birth Control:   Withdrawal.  03/29/2022 Archie Endo, MD

## 2022-03-29 NOTE — Lactation Note (Signed)
This note was copied from a baby's chart.  NICU Lactation Consultation Note  Patient Name: Diana Higgins [id number] Date: 03/29/2022 Age:28 hours  Subjective Reason for consult: Follow-up assessment; NICU baby; 1st time breastfeeding; Primapara; Term; Other (Comment) (DAT (+), ABO incompatibility)  Visited with family of 103 hours old FT NICU female; Diana Higgins is a P1 and reports she's trying to pump consistently but it's been challenging to keep up with the 3 hours schedule. Explained the importance of consistent pumping for the onset of lactogenesis II and to protect her supply, she voiced understanding. Her volumes continue to increase slowly, let her know that the purpose of pumping this early on is mainly for breast stimulation and not to get volume; praised her for her efforts. Her plan is to primarily breastfeed, she plans on putting baby to breast once he's stable. She's been using the # 27 flanges and reports it is working out for her. She has two pump kits, one in her room and another one in baby's. Diana Higgins is expecting to be discharged either today or tomorrow. Reviewed discharge education, pumping schedule, pump settings, lactogenesis II and anticipatory guidelines.  Objective Infant data: Mother's Current Feeding Choice: Breast Milk and Donor Milk  Infant feeding assessment Scale for Readiness: 3  Maternal data: G2P1011  C-Section, Low Transverse Significant Breast History:: (+) breast changes during the pregnancy Current breast feeding challenges:: NICU admission Pumping frequency: 5 times/24 hours Pumped volume: 10 mL Flange Size: 24; 27 Risk factor for low milk supply:: 2536 cc. blood loss due to [place], primipara, infant separation WIC Program: Yes WIC Referral Sent?: Yes Pump: Personal (two DEBP at home)  Assessment Infant: Feeding Status: -- (Scheduled)  Maternal: Milk volume: Normal  Intervention/Plan Interventions: Breast feeding basics reviewed; DEBP;  Education Tools: Pump; Flanges; Coconut oil Pump Education: Setup, frequency, and cleaning; Milk Storage  Plan of care: Encouraged bilateral pumping every 3 hours, ideally 8 pumping sessions/24 hours She'll take all pump parts to baby's room after her discharge She'll switch her pump settings from initiation to expression mode once she starts getting 20 ml of EBM combined  FOB present and supportive. All questions and concerns answered, family to contact [facility] services PRN.  Consult Status: NICU follow-up  NICU Follow-up type: Maternal D/C visit; Verify onset of copious milk; Verify absence of engorgement   Milca Sytsma S Alyzza Andringa 03/29/2022, 11:24 AM

## 2022-03-31 ENCOUNTER — Ambulatory Visit: Payer: Self-pay

## 2022-03-31 NOTE — Lactation Note (Signed)
This note was copied from a baby's chart.  NICU Lactation Consultation Note  Patient Name: Diana Higgins OHYWV'P Date: 03/31/2022 Age:28 days  Subjective Reason for consult: Follow-up assessment; NICU baby; 1st time breastfeeding; Primapara; Other (Comment) (DAT (+), ABO incompatibility)  Visited with family of 4 days FT NICU female; Diana Higgins reports her milk came in yesterday; she's been pumping every 3 hours, but sometimes skips at night; explained the importance of not going more than 6 hours without pumping at night for the prevention of engorgement and to protect her supply. She voiced her breasts were very full yesterday but they feel better today since she's been pumping consistently. Parents are taking baby "Diana Higgins" home today. Reviewed discharge education and the importance of continuing pumping after feedings at the breast. She has tried putting baby to breast but he didn't latch, parents have been focusing on bottle feedings while in the NICU but her goal is to primarily breastfeed after discharge. Referral to [facility]. sent. FOB present and supportive. All questions and concerns answered, family to contact [facility] services PRN.  Objective Infant data: Mother's Current Feeding Choice: Breast Milk  Infant feeding assessment Scale for Readiness: 1 Scale for Quality: 1  Maternal data: G2P1011  C-Section, Low Transverse Pumping frequency: 5 times/24 hours Pumped volume: 150 mL Flange Size: 27 WIC Program: Yes WIC Referral Sent?: Yes Pump: Personal, Hands Free (Two DEBP at home)  Assessment Infant: In NICU  Maternal: Milk volume: Normal No S/S of engorgement at this time  Intervention/Plan Interventions: Breast feeding basics reviewed; DEBP; Education Tools: Pump; Flanges Pump Education: Setup, frequency, and cleaning; Milk Storage  Plan: Consult Status: Complete   Diana Higgins 03/31/2022, 3:03 PM

## 2022-04-05 ENCOUNTER — Encounter (HOSPITAL_COMMUNITY): Payer: Self-pay | Admitting: Obstetrics and Gynecology

## 2022-04-05 ENCOUNTER — Inpatient Hospital Stay (HOSPITAL_COMMUNITY): Payer: Medicaid Other

## 2022-04-05 ENCOUNTER — Other Ambulatory Visit (HOSPITAL_COMMUNITY): Payer: Medicaid Other

## 2022-04-05 ENCOUNTER — Inpatient Hospital Stay (HOSPITAL_COMMUNITY)
Admission: AD | Admit: 2022-04-05 | Discharge: 2022-04-05 | Disposition: A | Discharge status: Home / Self Care | Payer: Medicaid Other | Attending: Obstetrics and Gynecology | Admitting: Obstetrics and Gynecology

## 2022-04-05 DIAGNOSIS — J101 Influenza due to other identified influenza virus with other respiratory manifestations: Secondary | ICD-10-CM

## 2022-04-05 DIAGNOSIS — O165 Unspecified maternal hypertension, complicating the puerperium: Secondary | ICD-10-CM | POA: Diagnosis not present

## 2022-04-05 DIAGNOSIS — O9953 Diseases of the respiratory system complicating the puerperium: Secondary | ICD-10-CM | POA: Insufficient documentation

## 2022-04-05 DIAGNOSIS — O9089 Other complications of the puerperium, not elsewhere classified: Secondary | ICD-10-CM | POA: Insufficient documentation

## 2022-04-05 DIAGNOSIS — I2694 Multiple subsegmental pulmonary emboli without acute cor pulmonale: Secondary | ICD-10-CM | POA: Insufficient documentation

## 2022-04-05 DIAGNOSIS — J1000 Influenza due to other identified influenza virus with unspecified type of pneumonia: Secondary | ICD-10-CM | POA: Diagnosis not present

## 2022-04-05 DIAGNOSIS — J189 Pneumonia, unspecified organism: Secondary | ICD-10-CM

## 2022-04-05 LAB — COMPREHENSIVE METABOLIC PANEL
ALT: 47 U/L — ABNORMAL HIGH (ref 0–44)
AST: 27 U/L (ref 15–41)
Albumin: 2.6 g/dL — ABNORMAL LOW (ref 3.5–5.0)
Alkaline Phosphatase: 182 U/L — ABNORMAL HIGH (ref 38–126)
Anion gap: 13 (ref 5–15)
BUN: 13 mg/dL (ref 6–20)
CO2: 20 mmol/L — ABNORMAL LOW (ref 22–32)
Calcium: 8.2 mg/dL — ABNORMAL LOW (ref 8.9–10.3)
Chloride: 105 mmol/L (ref 98–111)
Creatinine, Ser: 1.07 mg/dL — ABNORMAL HIGH (ref 0.44–1.00)
GFR, Estimated: >60 mL/min (ref >60)
Glucose, Bld: 69 mg/dL — ABNORMAL LOW (ref 70–99)
Potassium: 4.1 mmol/L (ref 3.5–5.1)
Sodium: 138 mmol/L (ref 135–145)
Total Bilirubin: 0.6 mg/dL (ref 0.3–1.2)
Total Protein: 5.8 g/dL — ABNORMAL LOW (ref 6.5–8.1)

## 2022-04-05 LAB — CBC
HCT: 23.6 % — ABNORMAL LOW (ref 36.0–46.0)
Hemoglobin: 7.1 g/dL — ABNORMAL LOW (ref 12.0–15.0)
MCH: 26.6 pg (ref 26.0–34.0)
MCHC: 30.1 g/dL (ref 30.0–36.0)
MCV: 88.4 fL (ref 80.0–100.0)
Platelets: 372 10*3/uL (ref 150–400)
RBC: 2.67 MIL/uL — ABNORMAL LOW (ref 3.87–5.11)
RDW: 14.8 % (ref 11.5–15.5)
WBC: 5.3 10*3/uL (ref 4.0–10.5)
nRBC: 1.7 % — ABNORMAL HIGH (ref 0.0–0.2)

## 2022-04-05 MED ORDER — AMOXICILLIN-POT CLAVULANATE 875-125 MG PO TABS: 1.0000 | ORAL_TABLET | Freq: Two times a day (BID) | ORAL | 0 refills | Status: DC

## 2022-04-05 MED ORDER — IOHEXOL 350 MG/ML SOLN
75.0000 mL | Freq: Once | INTRAVENOUS | Status: AC | PRN
  Administered 2022-04-05: 75 mL via INTRAVENOUS

## 2022-04-05 MED ORDER — OSELTAMIVIR PHOSPHATE 75 MG PO CAPS: 75.0000 mg | ORAL_CAPSULE | Freq: Two times a day (BID) | ORAL | 0 refills | Status: DC

## 2022-04-05 MED ORDER — FUROSEMIDE 20 MG PO TABS: 20.0000 mg | ORAL_TABLET | Freq: Every day | ORAL | 0 refills | Status: DC

## 2022-04-05 MED ORDER — NIFEDIPINE ER OSMOTIC RELEASE 30 MG PO TB24: 30.0000 mg | ORAL_TABLET | Freq: Every day | ORAL | 11 refills | Status: DC

## 2022-04-05 MED ORDER — AZITHROMYCIN 250 MG PO TABS: 250.0000 mg | ORAL_TABLET | Freq: Every day | ORAL | 0 refills | Status: DC

## 2022-04-05 NOTE — MAU Provider Note (Signed)
History     CSN: [id number]  Arrival date and time: 04/05/22 1113   Event Date/Time   First Provider Initiated Contact with Patient 04/05/22 1148      Chief Complaint  Patient presents with   Hypertension   Diana Higgins , a  27 y.o. G2P1011 at 1 week PP via C/S presents to MAU after being sent over from [facility] for Elevated BP and Shortness of breath with a cough. Patient states she has had several elevated BPs throughout the pregnancy and was being closely monitored for gestational hypertension. She states she has been experiencing an off and on headache on-going since delivery. Denies headache at this time. She states that her BPs at home have been >160/100s but "come back down" to 140's/ 100's. She states in UC her BPs were >160/110 and they recommended to be seen in MAU. She denies visual changes and epigastric pain. Patient reports her swelling has not decreased since delivery, but states that it has not gotten worse either.   -She also complains of cough and shortness of breath since Tuesday. She denies fever, chills, runny nose or congestion. She denies mucus production with a cough. She states she tested positive for Flu A at [facility] and a chest x-ray was done and patient was told "she had fluid around her lungs." She states she gets out of breath on short walking distances and  is worse with lying down. Patient states she cannot lay flat without her "chest feeling heavy and coughing."  She denies feeling light headed of dizzy. Denies difficulty breathing.          OB History     Gravida  2   Para  1   Term  1   Preterm      AB  1   Living  1      SAB      IAB      Ectopic      Multiple  0   Live Births  1           Past Medical History:  Diagnosis Date   Cholestasis during pregnancy    Gallstones    Heart murmur    since birth per pt    Past Surgical History:  Procedure Laterality Date   CESAREAN SECTION N/A 03/27/2022   Procedure: CESAREAN SECTION;   Surgeon: Drema Dallas, DO;  Location: [place] LD ORS;  Service: Obstetrics;  Laterality: N/A;   INDUCED ABORTION      Family History  Problem Relation Age of Onset   Kidney disease Father    Hypertension Father     Social History   Tobacco Use   Smoking status: Never   Smokeless tobacco: Never  Vaping Use   Vaping Use: Never used  Substance Use Topics   Alcohol use: Never   Drug use: Never    Allergies: No Known Allergies  Medications Prior to Admission  Medication Sig Dispense Refill Last Dose   acetaminophen (TYLENOL) 500 MG tablet Take 500 mg by mouth every 6 (six) hours as needed.   04/04/2022   Cholecalciferol (VITAMIN D-3 PO) Take 2 capsules by mouth daily.   04/04/2022   ibuprofen (ADVIL) 600 MG tablet Take 1 tablet (600 mg total) by mouth every 6 (six) hours. 30 tablet 0 04/04/2022   iron polysaccharides (NIFEREX) 150 MG capsule Take 1 capsule (150 mg total) by mouth daily. 42 capsule 0 04/04/2022   Prenatal Vit-Fe Fumarate-FA (PRENATAL MULTIVITAMIN) TABS tablet Take 1  tablet by mouth daily at 12 noon.   04/04/2022   oxyCODONE (OXY IR/ROXICODONE) 5 MG immediate release tablet Take 1-2 tablets (5-10 mg total) by mouth every 4 (four) hours as needed for moderate pain. 30 tablet 0 Unknown   Pantoprazole Sodium (PROTONIX PO) Take 1 tablet by mouth daily.       Review of Systems  Constitutional:  Negative for chills, fatigue and fever.  Eyes:  Negative for pain and visual disturbance.  Respiratory:  Positive for cough and shortness of breath. Negative for apnea and wheezing.   Cardiovascular:  Negative for chest pain and palpitations.  Gastrointestinal:  Negative for abdominal pain, constipation, diarrhea, nausea and vomiting.  Genitourinary:  Negative for difficulty urinating, dysuria, pelvic pain, vaginal bleeding, vaginal discharge and vaginal pain.  Musculoskeletal:  Negative for back pain.  Neurological:  Positive for headaches. Negative for dizziness, seizures, weakness and  light-headedness.  Psychiatric/Behavioral:  Negative for suicidal ideas.    Physical Exam   Blood pressure (!) 148/91, pulse 85, temperature 98.9 F (37.2 C), temperature source Oral, resp. rate (!) 22, height 5' 4"$  (1.626 m), weight 112.2 kg, SpO2 97 %, currently breastfeeding.  Physical Exam Vitals and nursing note reviewed.  Constitutional:      General: She is not in acute distress.    Appearance: Normal appearance.  HENT:     Head: Normocephalic.  Cardiovascular:     Rate and Rhythm: Normal rate and regular rhythm.  Pulmonary:     Effort: Tachypnea present.     Breath sounds: Examination of the right-lower field reveals rhonchi. Examination of the left-lower field reveals rhonchi. Decreased breath sounds and rhonchi present.  Musculoskeletal:        General: Swelling present.     Cervical back: Normal range of motion.  Skin:    General: Skin is warm and dry.  Neurological:     Mental Status: She is alert and oriented to person, place, and time.  Psychiatric:        Mood and Affect: Mood normal.    Patient SOB with speaking and Tachypnea.  Patient Vitals for the past 24 hrs:  BP Temp Temp src Pulse Resp SpO2 Height Weight  04/05/22 1500 (!) 148/91 -- -- 85 -- -- -- --  04/05/22 1447 (!) 153/87 -- -- 88 -- -- -- --  04/05/22 1401 (!) 153/88 -- -- 85 -- -- -- --  04/05/22 1332 (!) 153/86 -- -- 88 -- -- -- --  04/05/22 1327 (!) 149/89 -- -- 85 -- -- -- --  04/05/22 1245 (!) 148/90 -- -- 86 -- 97 % -- --  04/05/22 1233 (!) 149/82 -- -- 97 -- -- -- --  04/05/22 1215 (!) 146/89 -- -- 97 -- 94 % -- --  04/05/22 1200 (!) 143/87 -- -- 90 -- 93 % -- --  04/05/22 1145 -- 98.9 F (37.2 C) Oral 100 (!) 22 94 % -- --  04/05/22 1144 (!) 154/88 -- -- 97 -- -- -- --  04/05/22 1128 (!) 162/87 99 F (37.2 C) Oral (!) 104 (!) 24 95 % 5' 4"$  (1.626 m) 112.2 kg     MAU Course  Procedures Orders Placed This Encounter  Procedures   DG Chest 2 View   CT Angio Chest Pulmonary  Embolism (PE) W or WO Contrast   Comprehensive metabolic panel   CBC   Legionella Pneumophila Serogp 1 Ur Ag   Mycoplasma pneumoniae antibody, IgM   Droplet Isolation   Discharge  patient    MDM Results for orders placed or performed during the hospital encounter of 04/05/22 (from the past 24 hour(s))  Comprehensive metabolic panel     Status: Abnormal   Collection Time: 04/05/22 12:00 PM  Result Value Ref Range   Sodium 138 135 - 145 mmol/L   Potassium 4.1 3.5 - 5.1 mmol/L   Chloride 105 98 - 111 mmol/L   CO2 20 (L) 22 - 32 mmol/L   Glucose, Bld 69 (L) 70 - 99 mg/dL   BUN 13 6 - 20 mg/dL   Creatinine, Ser 1.07 (H) 0.44 - 1.00 mg/dL   Calcium 8.2 (L) 8.9 - 10.3 mg/dL   Total Protein 5.8 (L) 6.5 - 8.1 g/dL   Albumin 2.6 (L) 3.5 - 5.0 g/dL   AST 27 15 - 41 U/L   ALT 47 (H) 0 - 44 U/L   Alkaline Phosphatase 182 (H) 38 - 126 U/L   Total Bilirubin 0.6 0.3 - 1.2 mg/dL   GFR, Estimated >60 >60 mL/min   Anion gap 13 5 - 15  CBC     Status: Abnormal   Collection Time: 04/05/22 12:00 PM  Result Value Ref Range   WBC 5.3 4.0 - 10.5 K/uL   RBC 2.67 (L) 3.87 - 5.11 MIL/uL   Hemoglobin 7.1 (L) 12.0 - 15.0 g/dL   HCT 23.6 (L) 36.0 - 46.0 %   MCV 88.4 80.0 - 100.0 fL   MCH 26.6 26.0 - 34.0 pg   MCHC 30.1 30.0 - 36.0 g/dL   RDW 14.8 11.5 - 15.5 %   Platelets 372 150 - 400 K/uL   nRBC 1.7 (H) 0.0 - 0.2 %   CT Angio Chest Pulmonary Embolism (PE) W or WO Contrast  Result Date: 04/05/2022 CLINICAL DATA:  Cough and shortness of breath. EXAM: CT ANGIOGRAPHY CHEST WITH CONTRAST TECHNIQUE: Multidetector CT imaging of the chest was performed using the standard protocol during bolus administration of intravenous contrast. Multiplanar CT image reconstructions and MIPs were obtained to evaluate the vascular anatomy. RADIATION DOSE REDUCTION: This exam was performed according to the departmental dose-optimization program which includes automated exposure control, adjustment of the mA and/or kV  according to patient size and/or use of iterative reconstruction technique. CONTRAST:  45m OMNIPAQUE IOHEXOL 350 MG/ML SOLN COMPARISON:  None Available. FINDINGS: Cardiovascular: Satisfactory opacification of the pulmonary arteries to the lobar level. Evaluation of the segmental and subsegmental branches is limited for pulmonary embolus secondary to the degree of opacification. No evidence of pulmonary embolism. Normal heart size. No pericardial effusion. Mediastinum/Nodes: No enlarged mediastinal, hilar, or axillary lymph nodes. Thyroid gland, trachea, and esophagus demonstrate no significant findings. Lungs/Pleura: Bilateral upper lobe, right middle lobe and bilateral lower lobe airspace disease most consistent with multilobar pneumonia. No pleural effusion or pneumothorax. Upper Abdomen: No acute abnormality. Musculoskeletal: No chest wall abnormality. No acute or significant osseous findings. Review of the MIP images confirms the above findings. IMPRESSION: 1. No evidence of pulmonary embolism to the lobar level. 2. Bilateral multilobar pneumonia. Electronically Signed   By: HKathreen DevoidM.D.   On: 04/05/2022 14:47   DG Chest 2 View  Result Date: 04/05/2022 CLINICAL DATA:  Short of breath EXAM: CHEST - 2 VIEW COMPARISON:  None Available. FINDINGS: Scattered foci of interstitial/airspace opacity in the bilateral perihilar regions and lower lobes. No pulmonary edema, pleural effusion or pneumothorax. No acute osseous abnormality. IMPRESSION: Patchy airspace opacities bilaterally. Differential considerations include multifocal pneumonia versus atypical/viral pneumonia. Electronically Signed  By: Jacqulynn Cadet M.D.   On: 04/05/2022 13:21    - Flu Positive results reviewed from Urgent Care  - ALT mildly elevated, AST normal. No Severe PreE signs and symptoms. Elevated BPs in MAU. Low suspicion for PreE.  - White Count Normal, Patient afebrile.  - Paitnet maintained Sats at 95% or above on RA.  -  Consulted Dr Landry Mellow for plan of care based on patient BP's and mildly elevated Liver Enzymes. MD agrees with low suspicion for PreE.  -  Also discussed Pneumonia diagnosis and recommendations. Per MD consult hospitalist for treatment of pneumonia.   - Consulted Dr. Roosevelt Locks ED hospitalist. MD reviewed imagiang. Placed new orders and reviewed current lab results. Per MD patient is stable for discharge. Treat patient with a 7 day course of antibiotics of Azithromycin and Augmentin.  - Plan for discharge.   Assessment and Plan   1. Influenza A   2. Pneumonia of both lower lobes due to infectious organism   3. Postpartum hypertension   4. Postpartum care and examination    - Discussed OTC options for symptom management of Flu and limiting exposure for Newborn  - Also discussed PreE precautions and when to return.  - Reviewed worsening signs and return precautions.  - Patient discharged home in stable condition and may return to MAU as needed.   Jacquiline Doe, MSN CNM  04/05/2022, 3:39 PM

## 2022-04-05 NOTE — MAU Note (Signed)
Diana Higgins is a 27 y.o. at 43w2dhere in MAU reporting: c/s on 2/1. BP has been elevated since delivery.  Not on any BP meds.  BP has been fluctuating since discharger.  Went to UC a couple hrs ago, 169/100, p 128.  Went there because of cough and SOB (since TUes).  +flu, neg Covid.  Xray showed  ? Fluid around the lungs.  Was told to come here for further eval. Denies HA now (that has had off and on), denies visual changes, ankles and feet are swollen, denies RUQ pain.   Onset of complaint: Tues - resp Pain score: denies Vitals:   04/05/22 1128  BP: (!) 162/87  Pulse: (!) 104  Resp: (!) 24  Temp: 99 F (37.2 C)  SpO2: 95%      Lab orders placed from triage:

## 2022-04-07 LAB — MYCOPLASMA PNEUMONIAE ANTIBODY, IGM: Mycoplasma pneumo IgM: <770 U/mL (ref 0–769)

## 2022-04-08 ENCOUNTER — Telehealth (HOSPITAL_COMMUNITY): Payer: Self-pay | Admitting: *Deleted

## 2022-04-08 LAB — LEGIONELLA PNEUMOPHILA SEROGP 1 UR AG: L. pneumophila Serogp 1 Ur Ag: NEGATIVE

## 2022-04-08 NOTE — Telephone Encounter (Signed)
Mom reports feeling good. Incision healing well per mom. No concerns regarding herself at this time. EPDS=4 (hospital score=7) Mom reports baby is well. Feeding, peeing, and pooping without difficulty. Reviewed safe sleep. Mom has no concerns about baby at present.  Odis Hollingshead, RN 04-08-2022 at 2:20pm

## 2022-08-28 DIAGNOSIS — Z8759 Personal history of other complications of pregnancy, childbirth and the puerperium: Secondary | ICD-10-CM | POA: Insufficient documentation

## 2022-09-03 LAB — OB RESULTS CONSOLE HIV ANTIBODY (ROUTINE TESTING): HIV: NONREACTIVE

## 2022-09-03 LAB — OB RESULTS CONSOLE ANTIBODY SCREEN: Antibody Screen: NEGATIVE

## 2022-09-03 LAB — OB RESULTS CONSOLE GC/CHLAMYDIA
Chlamydia: NEGATIVE
Neisseria Gonorrhea: NEGATIVE

## 2022-09-03 LAB — OB RESULTS CONSOLE RUBELLA ANTIBODY, IGM: Rubella: IMMUNE

## 2022-09-03 LAB — OB RESULTS CONSOLE HEPATITIS B SURFACE ANTIGEN: Hepatitis B Surface Ag: NEGATIVE

## 2022-09-03 LAB — OB RESULTS CONSOLE RPR: RPR: NONREACTIVE

## 2022-10-01 LAB — HEPATITIS C ANTIBODY: HCV Ab: NEGATIVE

## 2022-10-14 ENCOUNTER — Telehealth: Payer: Self-pay

## 2022-10-14 ENCOUNTER — Other Ambulatory Visit: Payer: Self-pay | Admitting: Obstetrics and Gynecology

## 2022-10-14 DIAGNOSIS — Z363 Encounter for antenatal screening for malformations: Secondary | ICD-10-CM

## 2022-11-03 DIAGNOSIS — Z8719 Personal history of other diseases of the digestive system: Secondary | ICD-10-CM | POA: Insufficient documentation

## 2022-11-03 DIAGNOSIS — O99210 Obesity complicating pregnancy, unspecified trimester: Secondary | ICD-10-CM | POA: Insufficient documentation

## 2022-11-03 DIAGNOSIS — O09899 Supervision of other high risk pregnancies, unspecified trimester: Secondary | ICD-10-CM | POA: Insufficient documentation

## 2022-11-06 ENCOUNTER — Ambulatory Visit: Payer: Medicaid Other

## 2022-11-06 ENCOUNTER — Ambulatory Visit: Payer: Medicaid Other | Attending: Obstetrics and Gynecology

## 2022-11-06 ENCOUNTER — Other Ambulatory Visit: Payer: Self-pay | Admitting: *Deleted

## 2022-11-06 VITALS — BP 126/77 | HR 92

## 2022-11-06 DIAGNOSIS — O99210 Obesity complicating pregnancy, unspecified trimester: Secondary | ICD-10-CM | POA: Diagnosis present

## 2022-11-06 DIAGNOSIS — Z8759 Personal history of other complications of pregnancy, childbirth and the puerperium: Secondary | ICD-10-CM | POA: Diagnosis present

## 2022-11-06 DIAGNOSIS — O34219 Maternal care for unspecified type scar from previous cesarean delivery: Secondary | ICD-10-CM

## 2022-11-06 DIAGNOSIS — Z363 Encounter for antenatal screening for malformations: Secondary | ICD-10-CM | POA: Insufficient documentation

## 2022-11-06 DIAGNOSIS — O09892 Supervision of other high risk pregnancies, second trimester: Secondary | ICD-10-CM

## 2022-11-06 DIAGNOSIS — O99212 Obesity complicating pregnancy, second trimester: Secondary | ICD-10-CM

## 2022-11-06 DIAGNOSIS — O09899 Supervision of other high risk pregnancies, unspecified trimester: Secondary | ICD-10-CM | POA: Insufficient documentation

## 2022-11-06 DIAGNOSIS — O132 Gestational [pregnancy-induced] hypertension without significant proteinuria, second trimester: Secondary | ICD-10-CM

## 2022-11-06 DIAGNOSIS — Z8719 Personal history of other diseases of the digestive system: Secondary | ICD-10-CM

## 2022-12-02 DIAGNOSIS — R748 Abnormal levels of other serum enzymes: Secondary | ICD-10-CM | POA: Insufficient documentation

## 2022-12-02 DIAGNOSIS — R7989 Other specified abnormal findings of blood chemistry: Secondary | ICD-10-CM | POA: Insufficient documentation

## 2022-12-02 DIAGNOSIS — O280 Abnormal hematological finding on antenatal screening of mother: Secondary | ICD-10-CM | POA: Insufficient documentation

## 2022-12-02 DIAGNOSIS — O3110X0 Continuing pregnancy after spontaneous abortion of one fetus or more, unspecified trimester, not applicable or unspecified: Secondary | ICD-10-CM | POA: Insufficient documentation

## 2022-12-11 ENCOUNTER — Ambulatory Visit (HOSPITAL_BASED_OUTPATIENT_CLINIC_OR_DEPARTMENT_OTHER): Payer: Medicaid Other | Admitting: Obstetrics

## 2022-12-11 ENCOUNTER — Other Ambulatory Visit: Payer: Self-pay | Admitting: *Deleted

## 2022-12-11 ENCOUNTER — Other Ambulatory Visit: Payer: Self-pay

## 2022-12-11 ENCOUNTER — Ambulatory Visit: Payer: Medicaid Other | Attending: Maternal & Fetal Medicine

## 2022-12-11 DIAGNOSIS — O26643 Intrahepatic cholestasis of pregnancy, third trimester: Secondary | ICD-10-CM | POA: Insufficient documentation

## 2022-12-11 DIAGNOSIS — O99891 Other specified diseases and conditions complicating pregnancy: Secondary | ICD-10-CM

## 2022-12-11 DIAGNOSIS — O99212 Obesity complicating pregnancy, second trimester: Secondary | ICD-10-CM | POA: Diagnosis present

## 2022-12-11 DIAGNOSIS — O3110X0 Continuing pregnancy after spontaneous abortion of one fetus or more, unspecified trimester, not applicable or unspecified: Secondary | ICD-10-CM | POA: Diagnosis present

## 2022-12-11 DIAGNOSIS — Z3A24 24 weeks gestation of pregnancy: Secondary | ICD-10-CM

## 2022-12-11 DIAGNOSIS — O132 Gestational [pregnancy-induced] hypertension without significant proteinuria, second trimester: Secondary | ICD-10-CM

## 2022-12-11 DIAGNOSIS — O26642 Intrahepatic cholestasis of pregnancy, second trimester: Secondary | ICD-10-CM | POA: Insufficient documentation

## 2022-12-11 DIAGNOSIS — O34219 Maternal care for unspecified type scar from previous cesarean delivery: Secondary | ICD-10-CM | POA: Diagnosis present

## 2022-12-11 DIAGNOSIS — K831 Obstruction of bile duct: Secondary | ICD-10-CM | POA: Diagnosis not present

## 2022-12-11 DIAGNOSIS — R7989 Other specified abnormal findings of blood chemistry: Secondary | ICD-10-CM | POA: Insufficient documentation

## 2022-12-11 DIAGNOSIS — O99213 Obesity complicating pregnancy, third trimester: Secondary | ICD-10-CM | POA: Diagnosis present

## 2022-12-11 DIAGNOSIS — R748 Abnormal levels of other serum enzymes: Secondary | ICD-10-CM | POA: Diagnosis present

## 2022-12-11 DIAGNOSIS — O09292 Supervision of pregnancy with other poor reproductive or obstetric history, second trimester: Secondary | ICD-10-CM

## 2022-12-11 DIAGNOSIS — O281 Abnormal biochemical finding on antenatal screening of mother: Secondary | ICD-10-CM

## 2022-12-11 DIAGNOSIS — O3122X0 Continuing pregnancy after intrauterine death of one fetus or more, second trimester, not applicable or unspecified: Secondary | ICD-10-CM

## 2022-12-11 DIAGNOSIS — O280 Abnormal hematological finding on antenatal screening of mother: Secondary | ICD-10-CM | POA: Diagnosis present

## 2022-12-11 DIAGNOSIS — O09892 Supervision of other high risk pregnancies, second trimester: Secondary | ICD-10-CM | POA: Insufficient documentation

## 2022-12-11 DIAGNOSIS — E669 Obesity, unspecified: Secondary | ICD-10-CM

## 2022-12-11 NOTE — Progress Notes (Signed)
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  She is currently at 24 weeks and 1 day.  The patient reports that her last pregnancy which she delivered in February 2024, was complicated by cholestasis of pregnancy.    She began to develop itching symptoms again earlier in her current pregnancy.  She had labs drawn on November 15, 2022 which showed a total bile acid of 14 and mildly elevated liver function tests (AST 46, ALT 91).  Due to cholestasis of pregnancy, she is currently treated with Actigall 300 mg twice a day.  She reports that her itching symptoms have resolved since being started on Actigall.  Her pregnancy has also been complicated by gallstones.  She reports that she has been experiencing right upper quadrant pain which comes and goes.  She had plans for a cholecystectomy following her last pregnancy.  However, she conceived this pregnancy relatively soon after her last delivery and has put off the surgery.  She states that Tylenol sometimes may help relieve the pain.  She was informed that the overall EFW of 1 pound 7 ounces (39th percentile) measures appropriate for her gestational age.  There was normal amniotic fluid noted on today's exam.  The views of the fetal anatomy were visualized today.  There were no obvious anomalies noted.    The limitations of ultrasound in the detection of all anomalies was discussed.    The following were discussed during today's consultation:  Cholestasis of pregnancy  Cholestasis of pregnancy Is a liver disorder that usually occurs in the second and early third trimester's of pregnancy.    The condition is characterized by severe whole body itching with increased serum bile acids and possible elevations in the liver function tests.   Cholestasis of pregnancy is associated with increased risk of adverse obstetrical outcomes such as stillbirth and indicated preterm  birth.    The treatment for cholestasis stasis of pregnancy is ursodeoxycholic acid (Actigall).    As the patient reports that she is getting symptomatic relief with treatment, she should continue taking Actigall as prescribed.  Should her itching gets worse, the dosage of Actigall may be increased to as much as 500 mg 3 times a day.    Due to the increased risk of fetal demise associated with cholestasis of pregnancy, she should start weekly fetal testing with nonstress tests in your office starting at 28 weeks.  These tests should be continued until delivery.    Delivery for women who have cholestasis of pregnancy is recommended at around 37 weeks.    The patient reports that she delayed delivery until 39 weeks with her last pregnancy as she was asymptomatic and her bile acids normalized.  She was advised that this may be reasonable as long as her weekly fetal testing remains reassuring and her bile acids normalize again in her current pregnancy.  She should have assessments of her total bile acids and liver function tests once every 4 to 6 weeks.  We will continue to follow her with monthly growth scans.   Gallstones in pregnancy  The patient was advised to take Tylenol for symptomatic relief of her right upper quadrant pain associated with gallstones.  She understands that definitive treatment for gallstones is a cholecystectomy.    She was advised that should she require a cholecystectomy, the safest time to have the surgery performed would be sometime in the second trimester.  A  follow-up growth scan and NST was scheduled in our office 4 weeks.  The patient stated that all of her questions were answered today.  A total of 30 minutes was spent counseling and coordinating the care for this patient.  Greater than 50% of the time was spent in direct face-to-face contact.

## 2023-01-09 ENCOUNTER — Ambulatory Visit: Payer: Medicaid Other | Attending: Obstetrics

## 2023-01-09 ENCOUNTER — Other Ambulatory Visit: Payer: Self-pay

## 2023-01-09 ENCOUNTER — Ambulatory Visit (HOSPITAL_BASED_OUTPATIENT_CLINIC_OR_DEPARTMENT_OTHER): Payer: Medicaid Other | Admitting: *Deleted

## 2023-01-09 DIAGNOSIS — O09293 Supervision of pregnancy with other poor reproductive or obstetric history, third trimester: Secondary | ICD-10-CM

## 2023-01-09 DIAGNOSIS — O403XX0 Polyhydramnios, third trimester, not applicable or unspecified: Secondary | ICD-10-CM

## 2023-01-09 DIAGNOSIS — O26643 Intrahepatic cholestasis of pregnancy, third trimester: Secondary | ICD-10-CM

## 2023-01-09 DIAGNOSIS — O99212 Obesity complicating pregnancy, second trimester: Secondary | ICD-10-CM | POA: Insufficient documentation

## 2023-01-09 DIAGNOSIS — K831 Obstruction of bile duct: Secondary | ICD-10-CM

## 2023-01-09 DIAGNOSIS — E669 Obesity, unspecified: Secondary | ICD-10-CM

## 2023-01-09 DIAGNOSIS — Z3A28 28 weeks gestation of pregnancy: Secondary | ICD-10-CM

## 2023-01-09 DIAGNOSIS — O26613 Liver and biliary tract disorders in pregnancy, third trimester: Secondary | ICD-10-CM | POA: Diagnosis not present

## 2023-01-09 DIAGNOSIS — O34219 Maternal care for unspecified type scar from previous cesarean delivery: Secondary | ICD-10-CM

## 2023-01-09 DIAGNOSIS — O132 Gestational [pregnancy-induced] hypertension without significant proteinuria, second trimester: Secondary | ICD-10-CM | POA: Diagnosis not present

## 2023-01-09 DIAGNOSIS — O99213 Obesity complicating pregnancy, third trimester: Secondary | ICD-10-CM

## 2023-01-09 NOTE — Procedures (Signed)
Diana Higgins May 21, 1995 [redacted]w[redacted]d  Fetus A Non-Stress Test Interpretation for 01/09/23  Indication:  Cholestasis in pregnancy   Fetal Heart Rate A Mode: External Baseline Rate (A): 140 bpm Variability: Moderate Accelerations: 10 x 10 Decelerations: None Multiple birth?: No  Uterine Activity Mode: Toco Contraction Frequency (min): none  Interpretation (Fetal Testing) Nonstress Test Interpretation: Reactive Overall Impression: Reassuring for gestational age Comments: Dr. Parke Poisson, MD

## 2023-01-27 ENCOUNTER — Other Ambulatory Visit: Payer: Self-pay | Admitting: Obstetrics & Gynecology

## 2023-01-28 ENCOUNTER — Other Ambulatory Visit: Payer: Self-pay | Admitting: Obstetrics & Gynecology

## 2023-02-05 ENCOUNTER — Other Ambulatory Visit: Payer: Self-pay | Admitting: Obstetrics & Gynecology

## 2023-02-06 ENCOUNTER — Ambulatory Visit: Payer: Medicaid Other

## 2023-02-25 LAB — OB RESULTS CONSOLE GBS: GBS: NEGATIVE

## 2023-03-16 ENCOUNTER — Encounter (HOSPITAL_COMMUNITY): Payer: Self-pay

## 2023-03-16 NOTE — Patient Instructions (Signed)
Diana Higgins  03/16/2023   Your procedure is scheduled on:  03/28/2023  Arrive at 0800 at Entrance C on [address] at [facility]  and [address]. You are invited to use the FREE valet parking or use the Visitor's parking deck.  Pick up the phone at the desk and dial [phone].  Call this number if you have problems the morning of surgery: [phone]  Remember:   Do not eat food:(After Midnight) Desps de medianoche.  You may drink clear liquids until arrival at ___0800__.  Clear liquids means a liquid you can see thru.  It can have color such as Cola or Kool aid.  Tea is OK and coffee as long as no milk or creamer of any kind.  Take these medicines the morning of surgery with A SIP OF WATER:  Take ursodiol as prescribed   Do not wear jewelry, make-up or nail polish.  Do not wear lotions, powders, or perfumes. Do not wear deodorant.  Do not shave 48 hours prior to surgery.  Do not bring valuables to the hospital.  [facility] is not   responsible for any belongings or valuables brought to the hospital.  Contacts, dentures or bridgework may not be worn into surgery.  Leave suitcase in the car. After surgery it may be brought to your room.  For patients admitted to the hospital, checkout time is 11:00 AM the day of              discharge.      Please read over the following fact sheets that you were given:     Preparing for Surgery

## 2023-03-26 ENCOUNTER — Encounter (HOSPITAL_COMMUNITY)
Admission: RE | Admit: 2023-03-26 | Discharge: 2023-03-26 | Disposition: A | Discharge status: Home / Self Care | Payer: Medicaid Other | Source: Ambulatory Visit | Attending: Family Medicine | Admitting: Family Medicine

## 2023-03-26 ENCOUNTER — Encounter (HOSPITAL_COMMUNITY)
Admission: RE | Admit: 2023-03-26 | Discharge: 2023-03-26 | Disposition: A | Discharge status: Home / Self Care | Payer: Medicaid Other | Source: Ambulatory Visit | Attending: Obstetrics & Gynecology | Admitting: Obstetrics & Gynecology

## 2023-03-26 LAB — CBC
HCT: 36.8 % (ref 36.0–46.0)
Hemoglobin: 11.8 g/dL — ABNORMAL LOW (ref 12.0–15.0)
MCH: 27.2 pg (ref 26.0–34.0)
MCHC: 32.1 g/dL (ref 30.0–36.0)
MCV: 84.8 fL (ref 80.0–100.0)
Platelets: 170 10*3/uL (ref 150–400)
RBC: 4.34 MIL/uL (ref 3.87–5.11)
RDW: 13.1 % (ref 11.5–15.5)
WBC: 4.6 10*3/uL (ref 4.0–10.5)
nRBC: 0 % (ref 0.0–0.2)

## 2023-03-26 NOTE — Patient Instructions (Signed)
Diana Higgins  03/26/2023   Your procedure is scheduled on:  Saturday, March 28, 2023 at 9:30 am  Arrive at 7:30 am at [employer] on [address] at [facility] and [address]. You are invited to use the FREE valet parking or use the Visitor's parking deck.  Pick up the phone at the desk and dial [phone].  Call this number if you have problems the morning of surgery: [phone]  Remember:   Do not eat food:(After Midnight) Desps de medianoche.  Do not drink clear liquids: (After Midnight) Desps de medianoche.  Take these medicines the morning of surgery with A SIP OF WATER:  none   Do not wear jewelry, make-up or nail polish.  Do not wear lotions, powders, or perfumes. Do not wear deodorant.  Do not shave 48 hours prior to surgery.  Do not bring valuables to the hospital.  [facility] is not   responsible for any belongings or valuables brought to the hospital.  Contacts, dentures or bridgework may not be worn into surgery.  Leave suitcase in the car. After surgery it may be brought to your room.  For patients admitted to the hospital, checkout time is 11:00 AM the day of              discharge.      Please read over the following fact sheets that you were given:     Preparing for Surgery

## 2023-03-28 ENCOUNTER — Inpatient Hospital Stay (HOSPITAL_COMMUNITY): Payer: Medicaid Other | Admitting: Anesthesiology

## 2023-03-28 ENCOUNTER — Encounter (HOSPITAL_COMMUNITY): Payer: Self-pay | Admitting: Obstetrics & Gynecology

## 2023-03-28 ENCOUNTER — Inpatient Hospital Stay (HOSPITAL_COMMUNITY)
Admission: AD | Admit: 2023-03-28 | Discharge: 2023-03-30 | DRG: 787 | Disposition: A | Discharge status: Home / Self Care | Payer: Medicaid Other | Attending: Obstetrics & Gynecology | Admitting: Obstetrics & Gynecology

## 2023-03-28 ENCOUNTER — Encounter (HOSPITAL_COMMUNITY)
Admission: AD | Disposition: A | Discharge status: Home / Self Care | Payer: Self-pay | Source: Home / Self Care | Attending: Obstetrics & Gynecology

## 2023-03-28 ENCOUNTER — Other Ambulatory Visit: Payer: Self-pay

## 2023-03-28 DIAGNOSIS — Z9889 Other specified postprocedural states: Secondary | ICD-10-CM

## 2023-03-28 DIAGNOSIS — R030 Elevated blood-pressure reading, without diagnosis of hypertension: Secondary | ICD-10-CM | POA: Diagnosis not present

## 2023-03-28 DIAGNOSIS — O26643 Intrahepatic cholestasis of pregnancy, third trimester: Secondary | ICD-10-CM | POA: Diagnosis present

## 2023-03-28 DIAGNOSIS — O99214 Obesity complicating childbirth: Secondary | ICD-10-CM | POA: Diagnosis present

## 2023-03-28 DIAGNOSIS — E66813 Obesity, class 3: Secondary | ICD-10-CM | POA: Diagnosis present

## 2023-03-28 DIAGNOSIS — O9081 Anemia of the puerperium: Secondary | ICD-10-CM | POA: Diagnosis not present

## 2023-03-28 DIAGNOSIS — D62 Acute posthemorrhagic anemia: Secondary | ICD-10-CM | POA: Diagnosis not present

## 2023-03-28 DIAGNOSIS — Z8249 Family history of ischemic heart disease and other diseases of the circulatory system: Secondary | ICD-10-CM

## 2023-03-28 DIAGNOSIS — K7689 Other specified diseases of liver: Secondary | ICD-10-CM | POA: Diagnosis present

## 2023-03-28 DIAGNOSIS — E7879 Other disorders of bile acid and cholesterol metabolism: Secondary | ICD-10-CM | POA: Diagnosis present

## 2023-03-28 DIAGNOSIS — O9972 Diseases of the skin and subcutaneous tissue complicating childbirth: Secondary | ICD-10-CM | POA: Diagnosis present

## 2023-03-28 DIAGNOSIS — Z3A39 39 weeks gestation of pregnancy: Secondary | ICD-10-CM | POA: Diagnosis not present

## 2023-03-28 DIAGNOSIS — Z3A00 Weeks of gestation of pregnancy not specified: Secondary | ICD-10-CM

## 2023-03-28 DIAGNOSIS — L910 Hypertrophic scar: Secondary | ICD-10-CM | POA: Diagnosis present

## 2023-03-28 DIAGNOSIS — O34211 Maternal care for low transverse scar from previous cesarean delivery: Principal | ICD-10-CM | POA: Diagnosis present

## 2023-03-28 DIAGNOSIS — Z7982 Long term (current) use of aspirin: Secondary | ICD-10-CM | POA: Diagnosis not present

## 2023-03-28 DIAGNOSIS — O134 Gestational [pregnancy-induced] hypertension without significant proteinuria, complicating childbirth: Secondary | ICD-10-CM | POA: Diagnosis present

## 2023-03-28 DIAGNOSIS — O26649 Intrahepatic cholestasis of pregnancy, unspecified trimester: Secondary | ICD-10-CM | POA: Diagnosis present

## 2023-03-28 DIAGNOSIS — O2662 Liver and biliary tract disorders in childbirth: Secondary | ICD-10-CM | POA: Diagnosis not present

## 2023-03-28 LAB — DIC (DISSEMINATED INTRAVASCULAR COAGULATION)PANEL
D-Dimer, Quant: 1.39 ug{FEU}/mL — ABNORMAL HIGH (ref 0.00–0.50)
D-Dimer, Quant: 1.34 ug{FEU}/mL — ABNORMAL HIGH (ref 0.00–0.50)
Fibrinogen: 502 mg/dL — ABNORMAL HIGH (ref 210–475)
Fibrinogen: 548 mg/dL — ABNORMAL HIGH (ref 210–475)
INR: 1.1 (ref 0.8–1.2)
INR: 1.1 (ref 0.8–1.2)
Platelets: 150 10*3/uL (ref 150–400)
Platelets: 153 10*3/uL (ref 150–400)
Prothrombin Time: 13.9 s (ref 11.4–15.2)
Prothrombin Time: 13.9 s (ref 11.4–15.2)
Smear Review: NONE SEEN
Smear Review: NONE SEEN
aPTT: 29 s (ref 24–36)
aPTT: 28 s (ref 24–36)

## 2023-03-28 LAB — CBC
HCT: 33.8 % — ABNORMAL LOW (ref 36.0–46.0)
HCT: 32.1 % — ABNORMAL LOW (ref 36.0–46.0)
Hemoglobin: 11 g/dL — ABNORMAL LOW (ref 12.0–15.0)
Hemoglobin: 11 g/dL — ABNORMAL LOW (ref 12.0–15.0)
MCH: 27.7 pg (ref 26.0–34.0)
MCH: 28.7 pg (ref 26.0–34.0)
MCHC: 32.5 g/dL (ref 30.0–36.0)
MCHC: 34.3 g/dL (ref 30.0–36.0)
MCV: 85.1 fL (ref 80.0–100.0)
MCV: 83.8 fL (ref 80.0–100.0)
Platelets: 148 10*3/uL — ABNORMAL LOW (ref 150–400)
Platelets: 160 10*3/uL (ref 150–400)
RBC: 3.97 MIL/uL (ref 3.87–5.11)
RBC: 3.83 MIL/uL — ABNORMAL LOW (ref 3.87–5.11)
RDW: 13.2 % (ref 11.5–15.5)
RDW: 13.2 % (ref 11.5–15.5)
WBC: 10 10*3/uL (ref 4.0–10.5)
WBC: 10.1 10*3/uL (ref 4.0–10.5)
nRBC: 0 % (ref 0.0–0.2)
nRBC: 0 % (ref 0.0–0.2)

## 2023-03-28 LAB — POCT I-STAT EG7
Acid-base deficit: 4 mmol/L — ABNORMAL HIGH (ref 0.0–2.0)
Bicarbonate: 21.6 mmol/L (ref 20.0–28.0)
Calcium, Ion: 1.21 mmol/L (ref 1.15–1.40)
HCT: 25 % — ABNORMAL LOW (ref 36.0–46.0)
Hemoglobin: 8.5 g/dL — ABNORMAL LOW (ref 12.0–15.0)
O2 Saturation: 96 %
Potassium: 4 mmol/L (ref 3.5–5.1)
Sodium: 139 mmol/L (ref 135–145)
TCO2: 23 mmol/L (ref 22–32)
pCO2, Ven: 39.4 mm[Hg] — ABNORMAL LOW (ref 44–60)
pH, Ven: 7.347 (ref 7.25–7.43)
pO2, Ven: 85 mm[Hg] — ABNORMAL HIGH (ref 32–45)

## 2023-03-28 LAB — BASIC METABOLIC PANEL
Anion gap: 8 (ref 5–15)
BUN: 12 mg/dL (ref 6–20)
CO2: 21 mmol/L — ABNORMAL LOW (ref 22–32)
Calcium: 7.9 mg/dL — ABNORMAL LOW (ref 8.9–10.3)
Chloride: 107 mmol/L (ref 98–111)
Creatinine, Ser: 0.79 mg/dL (ref 0.44–1.00)
GFR, Estimated: >60 mL/min (ref >60)
Glucose, Bld: 79 mg/dL (ref 70–99)
Potassium: 4 mmol/L (ref 3.5–5.1)
Sodium: 136 mmol/L (ref 135–145)

## 2023-03-28 LAB — COMPREHENSIVE METABOLIC PANEL
ALT: 47 U/L — ABNORMAL HIGH (ref 0–44)
AST: 33 U/L (ref 15–41)
Albumin: 2.7 g/dL — ABNORMAL LOW (ref 3.5–5.0)
Alkaline Phosphatase: 192 U/L — ABNORMAL HIGH (ref 38–126)
Anion gap: 8 (ref 5–15)
BUN: 11 mg/dL (ref 6–20)
CO2: 23 mmol/L (ref 22–32)
Calcium: 8.5 mg/dL — ABNORMAL LOW (ref 8.9–10.3)
Chloride: 104 mmol/L (ref 98–111)
Creatinine, Ser: 0.74 mg/dL (ref 0.44–1.00)
GFR, Estimated: >60 mL/min (ref >60)
Glucose, Bld: 120 mg/dL — ABNORMAL HIGH (ref 70–99)
Potassium: 4.3 mmol/L (ref 3.5–5.1)
Sodium: 135 mmol/L (ref 135–145)
Total Bilirubin: 1.2 mg/dL (ref 0.0–1.2)
Total Protein: 5.7 g/dL — ABNORMAL LOW (ref 6.5–8.1)

## 2023-03-28 LAB — PROTEIN / CREATININE RATIO, URINE
Creatinine, Urine: 151 mg/dL
Protein Creatinine Ratio: 0.26 mg/mg{creat} — ABNORMAL HIGH (ref 0.00–0.15)
Total Protein, Urine: 40 mg/dL

## 2023-03-28 LAB — PREPARE RBC (CROSSMATCH)

## 2023-03-28 LAB — HIV ANTIBODY (ROUTINE TESTING W REFLEX): HIV Screen 4th Generation wRfx: NONREACTIVE

## 2023-03-28 LAB — RPR: RPR Ser Ql: NONREACTIVE

## 2023-03-28 SURGERY — Surgical Case
Anesthesia: Spinal

## 2023-03-28 MED ORDER — MORPHINE SULFATE (PF) 0.5 MG/ML IJ SOLN
INTRAMUSCULAR | Status: AC
  Filled 2023-03-28: qty 10

## 2023-03-28 MED ORDER — ENOXAPARIN SODIUM 60 MG/0.6ML IJ SOSY
60.0000 mg | PREFILLED_SYRINGE | INTRAMUSCULAR | Status: DC
Start: 2023-03-29 — End: 2023-03-31
  Administered 2023-03-29 – 2023-03-30 (×2): 60 mg via SUBCUTANEOUS
  Filled 2023-03-28: qty 0.6

## 2023-03-28 MED ORDER — KETOROLAC TROMETHAMINE 30 MG/ML IJ SOLN: 30.0000 mg | Freq: Four times a day (QID) | INTRAMUSCULAR | Status: AC | PRN

## 2023-03-28 MED ORDER — FENTANYL CITRATE (PF) 100 MCG/2ML IJ SOLN
INTRAMUSCULAR | Status: DC | PRN
  Administered 2023-03-28: 15 ug via INTRATHECAL

## 2023-03-28 MED ORDER — IBUPROFEN 600 MG PO TABS
600.0000 mg | ORAL_TABLET | Freq: Four times a day (QID) | ORAL | Status: DC
  Administered 2023-03-29 – 2023-03-30 (×6): 600 mg via ORAL
  Filled 2023-03-28 (×6): qty 1

## 2023-03-28 MED ORDER — OXYTOCIN-SODIUM CHLORIDE 30-0.9 UT/500ML-% IV SOLN
INTRAVENOUS | Status: AC
  Filled 2023-03-28: qty 500

## 2023-03-28 MED ORDER — MENTHOL 3 MG MT LOZG: 1.0000 | LOZENGE | OROMUCOSAL | Status: DC | PRN

## 2023-03-28 MED ORDER — FENTANYL CITRATE (PF) 100 MCG/2ML IJ SOLN
INTRAMUSCULAR | Status: AC
  Filled 2023-03-28: qty 2

## 2023-03-28 MED ORDER — NALOXONE HCL 4 MG/10ML IJ SOLN: 1.0000 ug/kg/h | INTRAVENOUS | Status: DC | PRN

## 2023-03-28 MED ORDER — ZOLPIDEM TARTRATE 5 MG PO TABS: 5.0000 mg | ORAL_TABLET | Freq: Every evening | ORAL | Status: DC | PRN

## 2023-03-28 MED ORDER — COCONUT OIL OIL: 1.0000 | TOPICAL_OIL | Status: DC | PRN

## 2023-03-28 MED ORDER — HYDROMORPHONE HCL 1 MG/ML IJ SOLN: 0.2000 mg | INTRAMUSCULAR | Status: DC | PRN

## 2023-03-28 MED ORDER — ACETAMINOPHEN 500 MG PO TABS
1000.0000 mg | ORAL_TABLET | Freq: Four times a day (QID) | ORAL | Status: DC
  Administered 2023-03-28 – 2023-03-30 (×7): 1000 mg via ORAL
  Filled 2023-03-28 (×9): qty 2

## 2023-03-28 MED ORDER — TRANEXAMIC ACID-NACL 1000-0.7 MG/100ML-% IV SOLN
1000.0000 mg | INTRAVENOUS | Status: AC
  Administered 2023-03-28 (×2): 1000 mg via INTRAVENOUS

## 2023-03-28 MED ORDER — METHYLERGONOVINE MALEATE 0.2 MG/ML IJ SOLN
INTRAMUSCULAR | Status: AC
  Filled 2023-03-28: qty 1

## 2023-03-28 MED ORDER — LACTATED RINGERS IV SOLN: INTRAVENOUS | Status: AC

## 2023-03-28 MED ORDER — ALBUMIN HUMAN 5 % IV SOLN: INTRAVENOUS | Status: DC | PRN

## 2023-03-28 MED ORDER — FENTANYL CITRATE (PF) 100 MCG/2ML IJ SOLN: 25.0000 ug | INTRAMUSCULAR | Status: DC | PRN

## 2023-03-28 MED ORDER — DEXAMETHASONE SODIUM PHOSPHATE 4 MG/ML IJ SOLN
INTRAMUSCULAR | Status: AC
  Filled 2023-03-28: qty 2

## 2023-03-28 MED ORDER — LIDOCAINE HCL 1 % IJ SOLN
INTRAMUSCULAR | Status: AC
  Filled 2023-03-28: qty 20

## 2023-03-28 MED ORDER — PHENYLEPHRINE HCL-NACL 20-0.9 MG/250ML-% IV SOLN
INTRAVENOUS | Status: AC
  Filled 2023-03-28: qty 250

## 2023-03-28 MED ORDER — SODIUM CHLORIDE 0.9 % IR SOLN
Status: DC | PRN
  Administered 2023-03-28: 500 mL

## 2023-03-28 MED ORDER — ONDANSETRON HCL 4 MG/2ML IJ SOLN
INTRAMUSCULAR | Status: AC
  Filled 2023-03-28: qty 2

## 2023-03-28 MED ORDER — SCOPOLAMINE 1 MG/3DAYS TD PT72
1.0000 | MEDICATED_PATCH | Freq: Once | TRANSDERMAL | Status: DC
  Filled 2023-03-28: qty 1

## 2023-03-28 MED ORDER — DIPHENHYDRAMINE HCL 50 MG/ML IJ SOLN
12.5000 mg | INTRAMUSCULAR | Status: DC | PRN
  Administered 2023-03-28: 12.5 mg via INTRAVENOUS
  Filled 2023-03-28: qty 1

## 2023-03-28 MED ORDER — KETOROLAC TROMETHAMINE 30 MG/ML IJ SOLN
30.0000 mg | Freq: Four times a day (QID) | INTRAMUSCULAR | Status: AC
  Administered 2023-03-28 – 2023-03-29 (×3): 30 mg via INTRAVENOUS
  Filled 2023-03-28 (×3): qty 1

## 2023-03-28 MED ORDER — SCOPOLAMINE 1 MG/3DAYS TD PT72
MEDICATED_PATCH | TRANSDERMAL | Status: DC | PRN
  Administered 2023-03-28: 1 via TRANSDERMAL

## 2023-03-28 MED ORDER — TRIAMCINOLONE ACETONIDE 40 MG/ML IJ SUSP: 40.0000 mg | Freq: Once | INTRAMUSCULAR | Status: DC

## 2023-03-28 MED ORDER — STERILE WATER FOR IRRIGATION IR SOLN
Status: DC | PRN
  Administered 2023-03-28: 1

## 2023-03-28 MED ORDER — ALBUMIN HUMAN 5 % IV SOLN
INTRAVENOUS | Status: AC
Start: 2023-03-28 — End: ?
  Filled 2023-03-28: qty 250

## 2023-03-28 MED ORDER — METOCLOPRAMIDE HCL 5 MG/ML IJ SOLN
INTRAMUSCULAR | Status: DC | PRN
  Administered 2023-03-28 (×2): 5 mg via INTRAVENOUS

## 2023-03-28 MED ORDER — DIPHENHYDRAMINE HCL 25 MG PO CAPS
25.0000 mg | ORAL_CAPSULE | Freq: Four times a day (QID) | ORAL | Status: DC | PRN
  Administered 2023-03-29: 25 mg via ORAL

## 2023-03-28 MED ORDER — METOCLOPRAMIDE HCL 5 MG/ML IJ SOLN
INTRAMUSCULAR | Status: AC
  Filled 2023-03-28: qty 2

## 2023-03-28 MED ORDER — OXYTOCIN-SODIUM CHLORIDE 30-0.9 UT/500ML-% IV SOLN: 2.5000 [IU]/h | INTRAVENOUS | Status: AC

## 2023-03-28 MED ORDER — LIDOCAINE HCL 1 % IJ SOLN
INTRAMUSCULAR | Status: DC | PRN
  Administered 2023-03-28: 20 mL via INTRADERMAL

## 2023-03-28 MED ORDER — OXYCODONE HCL 5 MG PO TABS: 5.0000 mg | ORAL_TABLET | ORAL | Status: DC | PRN

## 2023-03-28 MED ORDER — BUPIVACAINE IN DEXTROSE 0.75-8.25 % IT SOLN
INTRATHECAL | Status: DC | PRN
  Administered 2023-03-28: 1.8 mL via INTRATHECAL

## 2023-03-28 MED ORDER — METHYLERGONOVINE MALEATE 0.2 MG/ML IJ SOLN
INTRAMUSCULAR | Status: DC | PRN
  Administered 2023-03-28: .2 mg via INTRAMUSCULAR

## 2023-03-28 MED ORDER — SODIUM CHLORIDE 0.9 % IV SOLN
INTRAVENOUS | Status: AC
  Filled 2023-03-28: qty 3

## 2023-03-28 MED ORDER — DEXAMETHASONE SODIUM PHOSPHATE 10 MG/ML IJ SOLN
INTRAMUSCULAR | Status: DC | PRN
  Administered 2023-03-28: 8 mg via INTRAVENOUS

## 2023-03-28 MED ORDER — TRANEXAMIC ACID-NACL 1000-0.7 MG/100ML-% IV SOLN
INTRAVENOUS | Status: AC
Start: 2023-03-28 — End: ?
  Filled 2023-03-28: qty 100

## 2023-03-28 MED ORDER — TRIAMCINOLONE ACETONIDE 40 MG/ML IJ SUSP
INTRAMUSCULAR | Status: AC
  Filled 2023-03-28: qty 1

## 2023-03-28 MED ORDER — TRIAMCINOLONE ACETONIDE 40 MG/ML IJ SUSP
INTRAMUSCULAR | Status: DC | PRN
  Administered 2023-03-28: 40 mg via INTRAMUSCULAR

## 2023-03-28 MED ORDER — OXYTOCIN-SODIUM CHLORIDE 30-0.9 UT/500ML-% IV SOLN
INTRAVENOUS | Status: DC | PRN
  Administered 2023-03-28: 41.7 mL/h via INTRAVENOUS
  Administered 2023-03-28: 500 mL via INTRAVENOUS

## 2023-03-28 MED ORDER — DIBUCAINE (PERIANAL) 1 % EX OINT: 1.0000 | TOPICAL_OINTMENT | CUTANEOUS | Status: DC | PRN

## 2023-03-28 MED ORDER — ONDANSETRON HCL 4 MG/2ML IJ SOLN
INTRAMUSCULAR | Status: DC | PRN
  Administered 2023-03-28: 4 mg via INTRAVENOUS

## 2023-03-28 MED ORDER — PRENATAL MULTIVITAMIN CH
1.0000 | ORAL_TABLET | Freq: Every day | ORAL | Status: DC
  Administered 2023-03-29 – 2023-03-30 (×2): 1 via ORAL
  Filled 2023-03-28 (×2): qty 1

## 2023-03-28 MED ORDER — WITCH HAZEL-GLYCERIN EX PADS: 1.0000 | MEDICATED_PAD | CUTANEOUS | Status: DC | PRN

## 2023-03-28 MED ORDER — PHENYLEPHRINE HCL-NACL 20-0.9 MG/250ML-% IV SOLN
INTRAVENOUS | Status: DC | PRN
  Administered 2023-03-28: 60 ug/min via INTRAVENOUS

## 2023-03-28 MED ORDER — SODIUM CHLORIDE 0.9% IV SOLUTION: Freq: Once | INTRAVENOUS | Status: DC

## 2023-03-28 MED ORDER — SODIUM CHLORIDE 0.9 % IV SOLN
3.0000 g | Freq: Once | INTRAVENOUS | Status: AC
  Administered 2023-03-28 (×2): 3 g via INTRAVENOUS

## 2023-03-28 MED ORDER — SIMETHICONE 80 MG PO CHEW: 80.0000 mg | CHEWABLE_TABLET | ORAL | Status: DC | PRN

## 2023-03-28 MED ORDER — NALOXONE HCL 0.4 MG/ML IJ SOLN: 0.4000 mg | INTRAMUSCULAR | Status: DC | PRN

## 2023-03-28 MED ORDER — DIPHENHYDRAMINE HCL 25 MG PO CAPS
25.0000 mg | ORAL_CAPSULE | ORAL | Status: DC | PRN
  Filled 2023-03-28: qty 1

## 2023-03-28 MED ORDER — CEFAZOLIN SODIUM-DEXTROSE 1-4 GM/50ML-% IV SOLN: 1.0000 g | Freq: Three times a day (TID) | INTRAVENOUS | Status: DC

## 2023-03-28 MED ORDER — ONDANSETRON HCL 4 MG/2ML IJ SOLN
4.0000 mg | Freq: Three times a day (TID) | INTRAMUSCULAR | Status: DC | PRN
  Administered 2023-03-28: 4 mg via INTRAVENOUS

## 2023-03-28 MED ORDER — PHENYLEPHRINE 80 MCG/ML (10ML) SYRINGE FOR IV PUSH (FOR BLOOD PRESSURE SUPPORT)
PREFILLED_SYRINGE | INTRAVENOUS | Status: AC
  Filled 2023-03-28: qty 10

## 2023-03-28 MED ORDER — MORPHINE SULFATE (PF) 0.5 MG/ML IJ SOLN
INTRAMUSCULAR | Status: DC | PRN
  Administered 2023-03-28: 150 ug via INTRATHECAL

## 2023-03-28 MED ORDER — ACETAMINOPHEN 500 MG PO TABS: 1000.0000 mg | ORAL_TABLET | Freq: Four times a day (QID) | ORAL | Status: DC

## 2023-03-28 MED ORDER — ACETAMINOPHEN 10 MG/ML IV SOLN
INTRAVENOUS | Status: DC | PRN
  Administered 2023-03-28: 1000 mg via INTRAVENOUS

## 2023-03-28 MED ORDER — ALBUMIN HUMAN 5 % IV SOLN
INTRAVENOUS | Status: AC
  Filled 2023-03-28: qty 250

## 2023-03-28 MED ORDER — POVIDONE-IODINE 10 % EX SWAB: Freq: Once | CUTANEOUS | Status: DC

## 2023-03-28 MED ORDER — SOD CITRATE-CITRIC ACID 500-334 MG/5ML PO SOLN: 30.0000 mL | ORAL | Status: DC

## 2023-03-28 MED ORDER — TRANEXAMIC ACID-NACL 1000-0.7 MG/100ML-% IV SOLN
INTRAVENOUS | Status: AC
  Filled 2023-03-28: qty 100

## 2023-03-28 MED ORDER — SODIUM CHLORIDE 0.9% FLUSH: 3.0000 mL | INTRAVENOUS | Status: DC | PRN

## 2023-03-28 SURGICAL SUPPLY — 36 items
BENZOIN TINCTURE PRP APPL 2/3 (GAUZE/BANDAGES/DRESSINGS) IMPLANT
CHLORAPREP W/TINT 26 (MISCELLANEOUS) ×2 IMPLANT
CLAMP UMBILICAL CORD (MISCELLANEOUS) ×1 IMPLANT
CLOTH BEACON ORANGE TIMEOUT ST (SAFETY) ×1 IMPLANT
DERMABOND ADVANCED .7 DNX12 (GAUZE/BANDAGES/DRESSINGS) IMPLANT
DRAPE C SECTION CLR SCREEN (DRAPES) ×1 IMPLANT
DRSG OPSITE POSTOP 4X10 (GAUZE/BANDAGES/DRESSINGS) ×1 IMPLANT
ELECT REM PT RETURN 9FT ADLT (ELECTROSURGICAL) ×1
ELECTRODE REM PT RTRN 9FT ADLT (ELECTROSURGICAL) ×1 IMPLANT
EXTRACTOR VACUUM KIWI (MISCELLANEOUS) ×1 IMPLANT
GAUZE SPONGE 4X4 12PLY STRL LF (GAUZE/BANDAGES/DRESSINGS) IMPLANT
GLOVE BIOGEL PI IND STRL 7.0 (GLOVE) ×2 IMPLANT
GLOVE SURG SS PI 6.5 STRL IVOR (GLOVE) ×1 IMPLANT
GOWN STRL REUS W/TWL LRG LVL3 (GOWN DISPOSABLE) ×2 IMPLANT
HEMOSTAT ARISTA ABSORB 3G PWDR (HEMOSTASIS) IMPLANT
KIT ABG SYR 3ML LUER SLIP (SYRINGE) IMPLANT
MAT PREVALON FULL STRYKER (MISCELLANEOUS) IMPLANT
NDL HYPO 25X5/8 SAFETYGLIDE (NEEDLE) IMPLANT
NDL KEITH (NEEDLE) ×1 IMPLANT
NEEDLE HYPO 25X5/8 SAFETYGLIDE (NEEDLE) IMPLANT
NEEDLE KEITH (NEEDLE) ×1 IMPLANT
NS IRRIG 1000ML POUR BTL (IV SOLUTION) ×1 IMPLANT
PACK C SECTION WH (CUSTOM PROCEDURE TRAY) ×1 IMPLANT
PAD ABD DERMACEA PRESS 5X9 (GAUZE/BANDAGES/DRESSINGS) IMPLANT
PAD OB MATERNITY 4.3X12.25 (PERSONAL CARE ITEMS) ×1 IMPLANT
RTRCTR C-SECT PINK 25CM LRG (MISCELLANEOUS) ×1 IMPLANT
SUT CHROMIC 1 CTX 36 (SUTURE) IMPLANT
SUT CHROMIC 2 0 CT 1 (SUTURE) ×1 IMPLANT
SUT PLAIN 1 NONE 54 (SUTURE) IMPLANT
SUT PLAIN 2 0 XLH (SUTURE) IMPLANT
SUT PLAIN ABS 2-0 CT1 27XMFL (SUTURE) IMPLANT
SUT VIC AB 0 CTX36XBRD ANBCTRL (SUTURE) ×1 IMPLANT
SUT VIC AB 1 CTX36XBRD ANBCTRL (SUTURE) ×2 IMPLANT
TOWEL OR 17X24 6PK STRL BLUE (TOWEL DISPOSABLE) ×1 IMPLANT
TRAY FOLEY W/BAG SLVR 14FR LF (SET/KITS/TRAYS/PACK) ×1 IMPLANT
WATER STERILE IRR 1000ML POUR (IV SOLUTION) ×1 IMPLANT

## 2023-03-28 NOTE — Lactation Note (Signed)
This note was copied from a baby's chart. Lactation Consultation Note  Patient Name: Diana Higgins [id number] Date: 03/28/2023 Age:29 hours Reason for consult: Initial assessment;Term (delivery blood loss 2134)  P2- MOB reports that infant has latched well to the breast so far, but he is a little sleepy at times. LC reviewed the first 24 hr birthday nap and different ways to stimulate him into eating. MOB denies experiencing any pain or pinching when infant nurses. MOB plans to pump when going back to work. MOB would like to produce enough milk to offer to her 29 year old as well as the infant. Infant had not eaten in 3 hrs, so LC offered to assist with a latch. MOB declined assistance at this time.  LC reviewed feeding infant on cue 8-12x in 24 hrs, not allowing infant to go over 3 hrs without a feeding, CDC milk storage guidelines and LC services handout. LC encouraged MOB to call for further assistance as needed.  Maternal Data Has patient been taught Hand Expression?: Yes Does the patient have breastfeeding experience prior to this delivery?: Yes How long did the patient breastfeed?: 6 months  Feeding Mother's Current Feeding Choice: Breast Milk  LATCH Score Latch: Repeated attempts needed to sustain latch, nipple held in mouth throughout feeding, stimulation needed to elicit sucking reflex.  Audible Swallowing: A few with stimulation  Type of Nipple: Everted at rest and after stimulation  Comfort (Breast/Nipple): Soft / non-tender  Hold (Positioning): Assistance needed to correctly position infant at breast and maintain latch.  LATCH Score: 7   Lactation Tools Discussed/Used Pump Education: Milk Storage  Interventions Interventions: Breast feeding basics reviewed;Education;LC Services brochure  Discharge Discharge Education: Warning signs for feeding baby Pump: Hands Free (hands free x3 and plans to purchase a DEBP through [employer])  Consult Status Consult Status:  Follow-up Date: 03/29/23 Follow-up type: In-patient    Dema Severin BS, IBCLC 03/28/2023, 6:11 PM

## 2023-03-28 NOTE — Anesthesia Preprocedure Evaluation (Addendum)
Anesthesia Evaluation  Patient identified by MRN, date of birth, ID band Patient awake    Reviewed: Allergy & Precautions, NPO status , Patient's Chart, lab work & pertinent test results  Airway Mallampati: III  TM Distance: >3 FB Neck ROM: Full    Dental no notable dental hx. (+) Teeth Intact, Dental Advisory Given   Pulmonary neg pulmonary ROS   Pulmonary exam normal breath sounds clear to auscultation       Cardiovascular Hypertension: gHTN. Normal cardiovascular exam Rhythm:Regular Rate:Normal     Neuro/Psych negative neurological ROS  negative psych ROS   GI/Hepatic negative GI ROS, Neg liver ROS,,,  Endo/Other    Class 3 obesity (BMI 45)  Renal/GU negative Renal ROS  negative genitourinary   Musculoskeletal negative musculoskeletal ROS (+)    Abdominal   Peds  Hematology negative hematology ROS (+)   Anesthesia Other Findings Repeat C/S  Reproductive/Obstetrics (+) Pregnancy                             Anesthesia Physical Anesthesia Plan  ASA: 3  Anesthesia Plan: Spinal   Post-op Pain Management:    Induction:   PONV Risk Score and Plan: Treatment may vary due to age or medical condition  Airway Management Planned: Natural Airway  Additional Equipment:   Intra-op Plan:   Post-operative Plan:   Informed Consent: I have reviewed the patients History and Physical, chart, labs and discussed the procedure including the risks, benefits and alternatives for the proposed anesthesia with the patient or authorized representative who has indicated his/her understanding and acceptance.     Dental advisory given  Plan Discussed with: CRNA  Anesthesia Plan Comments:        Anesthesia Quick Evaluation

## 2023-03-28 NOTE — Op Note (Signed)
Patient: Diana Higgins DOB: 1995/08/17 MRN:  [id number]  DATE OF SURGERY:  03/28/2023.  PREOP DIAGNOSIS:  1. 39 week 3 day EGA IUP. 2.  History of 1 cesarean section and is for a repeat cesarean delivery. 3. Previous cesarean section skin incision with keloid.  4. Cholestasis of pregnancy.  5. BMI 45.  POSTOP DIAGNOSIS: Same as above.    PROCEDURE:  1. Vacuum assisted Repeat low uterine segment transverse cesarean section via Pfannenstiel incision with revision of keloid scar.   SURGEON: Dr.  Hoover Browns.  ASSISTANTS:  Dr. Mittie Bodo, [facility]- fellow.  Dr. Derrel Nip.   SURGEON ATTESTATION: I was present and scrubbed for the entire case.  Experienced assistants were required given the standard of surgical care and the complexity of the case.  The assistants were needed for exposure, dissection, suctioning, retraction, instrument exchange,  assisting with delivery with administration of fundal pressure, and for overall help during the surgery.    ANESTHESIA: Spinal, Dr. Armond Hang, [place].   COMPLICATIONS: Postpartum hemorrhage with EBL of 2134 cc.   FINDINGS: Viable female infant in cephalic presentation, LOT position, weight 8lbs 10.3 oz, Apgar scores of 7 and 8. Normal uterus.  Normal bilateral ovaries, normal bilateral fallopian tubes.  Dense adhesions between rectus muscle and rectus fascia.    IV FLUID:   3000 cc LR, 500 cc albumin.   URINE OUTPUT:  200 cc.    INDICATIONS:  28 y/o G3P1011  who presented for a repeat cesarean at 39 weeks 3 days EGA and keloid scar revision.    PROCEDURE:  She was taken to the operating room where her spinal anesthesia was found to be adequate. She was prepped abdominally with chloraprep and vaginally with betadine.  She was draped in the usual sterile fashion, foley catheter and SCDs was placed. She received IV ancef and Iv tranexamic acid preoperatively. The  previous keloided  pfannenstiel incision was excised with a scalpel.  The incision was  extended through the subcutaneous layer and also the fascia with the bovie. Small perforators in the subcutaneous layer were contained with the Bovie. The fascia was opened in the midline and then was further separated from the rectus muscles bilaterally using Mayo scissors. Kochers were placed inferiorly and then superiorly to allow further separation of fascia from the rectus muscles with Mayo scissors.  The peritoneal cavity was entered bluntly and stretched out.  The Alexis retractor was placed in. The vesicouterine fold dissection was created using Metzenbaum scissors.  The uterus was incised with a scalpel transversely and the incision was extended bluntly bilaterally with fingers and with bandage scissors.  Membranes were ruptured and moderate clear amniotic fluid was noted.  The head could not be delivered with head flexion and fundal pressure.  Uterine incision was extended bilaterally with bandage scissors but still could not deliver the head.  Extra assistance was called for. Fundal pressure was applied and Kiwi Vacuum applied to the  green zone, with three pulls and three popoffs and head could still not be delivered (baby noted to have long hair).   With arrival of more assistance, fundal pressure was applied  and with head flexion we were finally able to deliver the baby's head then the rest of the body.  She delivered a viable female infant.  The edges of the uterus was grasped with T clamps.  The cord was clamped and cut after 1 minute. Cord blood was collected.    The uterus was not exteriorized.  The placenta  was delivered with gentle traction on the umbilical cord.  The uterus was cleared of clots and debris with a lap.  Pitocin had already been started.  The uterine incision was closed with #1 Vicryl in a running locked stitch. Significant bleeding on the left side of the uterine incision where sinuses were located was contained with multiple figure of 8 stitches, with adequate hemostasis  achieved.  Patient received methergine IM and a second dose of tranexamic acid at this point.  The uterus was noted to have good tone.  A second imbricating layer with # 1 vicryl was placed.    Irrigation was applied and suctioned out.  The uterine incision was noted to be hemostatic.  The peritoneum was then reapproximated using 2-0 chromic suture.  Rectus muscles were noted to thinned out and far apart and could not be re approximated.  Fascia was closed using 0 looped PDS.  The subcutaneous layer was irrigated and suctioned out. Small perforators were contained with the bovie.  The subcutaneous layer was closed using 1-0 plain in interrupted stitches.  Kenalog 40 mg/ml mixed with 10 cc 1% lidocaine was then injected into the dermis.  4- 0 monocryl was used to close the incision in a subcuticular stitch.  Patient was cleaned and dried.  Benzocaine, steri strips, honey comb dressing then pressure dressing were applied.  She was further cleaned and then taken to the recovery room with her baby in stable conditions.   SPECIMEN:  Placenta to labor and delivery.  Umbilical cord blood to lab.   DISPOSITION: TO PACU, STABLE.   Dr. Hoover Browns.  Date: 03/28/2023.

## 2023-03-28 NOTE — Interval H&P Note (Signed)
History and Physical Interval Note:  03/28/2023 10:20 AM  Diana Higgins  has presented today for surgery, with the diagnosis of Past pregnancy history of cesarean section.  The various methods of treatment have been discussed with the patient and family. After consideration of risks, benefits and other options for treatment, the patient has consented to  Procedure(s): REPEAT CESAREAN SECTION (N/A) WITH KELOID SCAR REVISION as a surgical intervention.  The patient's history has been reviewed, patient examined, no change in status, stable for surgery.  I have reviewed the patient's chart and labs.  Questions were answered to the patient's satisfaction.     Prescilla Sours, MD.

## 2023-03-28 NOTE — Transfer of Care (Signed)
Immediate Anesthesia Transfer of Care Note  Patient: Diana Higgins  Procedure(s) Performed: CESAREAN SECTION  Patient Location: PACU  Anesthesia Type:Spinal  Level of Consciousness: awake, alert , and oriented  Airway & Oxygen Therapy: Patient Spontanous Breathing  Post-op Assessment: Report given to RN and Post -op Vital signs reviewed and stable  Post vital signs: Reviewed and stable  Last Vitals:  Vitals Value Taken Time  BP 145/94 03/28/23 1345  Temp    Pulse 54 03/28/23 1349  Resp 16 03/28/23 1348  SpO2 98 % 03/28/23 1349  Vitals shown include unfiled device data.  Last Pain:  Vitals:   03/28/23 0842  TempSrc: Oral  PainSc: 0-No pain         Complications: No notable events documented.

## 2023-03-28 NOTE — H&P (Signed)
Diana Higgins is a 28 y.o. female G3P1011 at 19 weeks 2 days EGA with a history of 1 cesarean section presenting for a repeat cesarean section and keloid scar revision, patient declines trial of labor and VBAC.  LMP 07/06/22, EDC 04/02/23 by LMP, consistent with first trimester U/S.   Prenatal care provided at [facility] and has been significant for: Cholestasis of pregnancy, had elevated total bile acid of 14 at about 20 weeks of pregnancy which then normalized subsequently, last check on 02/07/23 showed total bile acid to be 4.4.  Patient used Ursodiol during pregnancy. Patient declined delivery at [redacted] weeks EGA for cholestasis of pregnancy and desires delivery today. Transient elevated LFTs that subsequently normalized.  Short inter conception period, last delivery by C/section was 03/27/2022.  OB History     Gravida  3   Para  1   Term  1   Preterm      AB  1   Living  1      SAB      IAB      Ectopic      Multiple  0   Live Births  1          Past Medical History:  Diagnosis Date   Acute blood loss anemia (ABLA) 03/28/2022   Arrest of dilation, delivered, current hospitalization 03/27/2022   Cholestasis during pregnancy    Encounter for induction of labor 03/25/2022   Gallstones    Gestational hypertension w/o significant proteinuria in 3rd trimester 03/27/2022   Heart murmur    since birth per pt   PPH (postpartum hemorrhage) 03/28/2022   Vaginal Pap smear, abnormal    Past Surgical History:  Procedure Laterality Date   CESAREAN SECTION N/A 03/27/2022   Procedure: CESAREAN SECTION;  Surgeon: Steva Ready, DO;  Location: [facility] LD ORS;  Service: Obstetrics;  Laterality: N/A;   INDUCED ABORTION     Family History: family history includes Breast cancer in her maternal grandmother; Hypertension in her father and paternal grandmother; Kidney disease in her father and paternal grandmother. Social History:  reports that she has never smoked. She has never  used smokeless tobacco. She reports that she does not drink alcohol and does not use drugs.     Maternal Diabetes: No Genetic Screening: Normal Maternal Ultrasounds/Referrals: Normal Fetal Ultrasounds or other Referrals:  None Maternal Substance Abuse:  No Significant Maternal Medications:  Meds include: Other:  Significant Maternal Lab Results:  Group B Strep negative Number of Prenatal Visits:greater than 3 verified prenatal visits Maternal Vaccinations: None Other Comments:  None  Review of Systems Constitutional: Denies fevers/chills Cardiovascular: Denies chest pain or palpitations Pulmonary: Denies coughing or wheezing Gastrointestinal: Denies nausea, vomiting or diarrhea Genitourinary: Denies pelvic pain, unusual vaginal bleeding, unusual vaginal discharge, dysuria, urgency or frequency.  Musculoskeletal: Denies muscle or joint aches and pain.  Neurology: Denies abnormal sensations such as tingling or numbness.     Blood pressure 139/77, pulse 78, temperature 98.3 F (36.8 C), temperature source Oral, resp. rate 17, height 5\' 4"  (1.626 m), weight 119.9 kg, last menstrual period 06/26/2022, SpO2 100%, currently breastfeeding. Exam Physical Exam  Constitutional: She is oriented to person, place, and time. She appears well-developed and well-nourished.  HENT:  Head: Normocephalic and atraumatic.  Neck: Normal range of motion.  Cardiovascular: Normal rate.    Respiratory: Effort normal.   GI: Soft.  Skin: Skin is warm and dry.  Psychiatric: She has a normal mood and affect. Her behavior is  normal.   Genitourinary: Gravid uterus, appropriate for gestational age.    Prenatal labs: ABO, Rh: --/--/O POS (01/30 7829) Antibody: NEG (01/30 5621) Rubella: Immune (07/10 0000) RPR: Nonreactive (07/10 0000)  HBsAg: Negative (07/10 0000)  HIV: Non-reactive (07/10 0000)  GBS: Negative/-- (01/01 0000)   Current Outpatient Medications  Medication Instructions   aspirin EC 81 mg,  Oral, Daily, Swallow whole.   Docosahexaenoic Acid (PRENATAL DHA PO) 1 tablet, Daily   ferrous sulfate 325 mg, Every other day   pantoprazole (PROTONIX) 40 mg, Oral, Daily PRN   Prenatal Vit-Fe Fumarate-FA (PRENATAL MULTIVITAMIN) TABS tablet 1 tablet, Daily   ursodiol (ACTIGALL) 300 mg, Oral, 2 times daily   Vitamin D 4,000 Units, Daily    Allergies  Allergen Reactions   Latex     Pt prefers no latex, her partner is allergic    Assessment/Plan: 33 y/o G3P1011 at 39 weeks 2 days EGA here for a repeat cesarean section and keloid scar revision, - Admit to Labor and Delivery OR as per orders. - This procedure has been fully reviewed with the patient and written informed consent has been obtained. Discussed risks, benefits and alternatives of the cesarean section. Risks include but are not limited to risks of bleeding, infection, damage to other organs and possible need of additional procedures, possible placenta problems in subsequent pregnancies.  - Discussed use of kenalog for keloid scar including risks of hypopigmentation of skin from kenalog use.  Prescilla Sours, MD.  03/28/2023, 10:06 AM

## 2023-03-28 NOTE — Anesthesia Procedure Notes (Signed)
Spinal  Patient location during procedure: OR Start time: 03/28/2023 10:57 AM End time: 03/28/2023 11:00 AM Reason for block: surgical anesthesia Staffing Performed: anesthesiologist  Anesthesiologist: Elmer Picker, MD Performed by: Elmer Picker, MD Authorized by: Elmer Picker, MD   Preanesthetic Checklist Completed: patient identified, IV checked, risks and benefits discussed, surgical consent, monitors and equipment checked, pre-op evaluation and timeout performed Spinal Block Patient position: sitting Prep: DuraPrep and site prepped and draped Patient monitoring: cardiac monitor, continuous pulse ox and blood pressure Approach: midline Location: L3-4 Injection technique: single-shot Needle Needle type: Pencan  Needle gauge: 24 G Needle length: 9 cm Assessment Sensory level: T6 Events: CSF return Additional Notes Functioning IV was confirmed and monitors were applied. Sterile prep and drape, including hand hygiene and sterile gloves were used. The patient was positioned and the spine was prepped. The skin was anesthetized with lidocaine.  Free flow of clear CSF was obtained prior to injecting local anesthetic into the CSF.  The spinal needle aspirated freely following injection.  The needle was carefully withdrawn.  The patient tolerated the procedure well.

## 2023-03-28 NOTE — Anesthesia Postprocedure Evaluation (Signed)
Anesthesia Post Note  Patient: Diana Higgins  Procedure(s) Performed: CESAREAN SECTION     Patient location during evaluation: PACU Anesthesia Type: Spinal Level of consciousness: oriented and awake and alert Pain management: pain level controlled Vital Signs Assessment: post-procedure vital signs reviewed and stable Respiratory status: spontaneous breathing, respiratory function stable and patient connected to nasal cannula oxygen Cardiovascular status: blood pressure returned to baseline and stable Postop Assessment: no headache, no backache and no apparent nausea or vomiting Anesthetic complications: no  No notable events documented.  Last Vitals:  Vitals:   03/28/23 1700 03/28/23 1800  BP: 128/84 128/80  Pulse: 65 69  Resp: 18 18  Temp: 36.8 C 36.7 C  SpO2: 97% 97%    Last Pain:  Vitals:   03/28/23 1800  TempSrc: Oral  PainSc: 0-No pain   Pain Goal:                   Dolce Sylvia L Evalene Vath

## 2023-03-29 DIAGNOSIS — R030 Elevated blood-pressure reading, without diagnosis of hypertension: Secondary | ICD-10-CM

## 2023-03-29 DIAGNOSIS — D62 Acute posthemorrhagic anemia: Secondary | ICD-10-CM

## 2023-03-29 DIAGNOSIS — O26649 Intrahepatic cholestasis of pregnancy, unspecified trimester: Secondary | ICD-10-CM | POA: Diagnosis present

## 2023-03-29 HISTORY — DX: Acute posthemorrhagic anemia: D62

## 2023-03-29 HISTORY — DX: Elevated blood-pressure reading, without diagnosis of hypertension: R03.0

## 2023-03-29 LAB — TYPE AND SCREEN
ABO/RH(D): O POS
Antibody Screen: NEGATIVE
Unit division: 0
Unit division: 0

## 2023-03-29 LAB — BPAM RBC
Blood Product Expiration Date: 202503032359
Blood Product Expiration Date: 202503032359
ISSUE DATE / TIME: 202502011314
ISSUE DATE / TIME: 202502011314
Unit Type and Rh: 5100
Unit Type and Rh: 5100

## 2023-03-29 LAB — CBC
HCT: 27.7 % — ABNORMAL LOW (ref 36.0–46.0)
Hemoglobin: 9.3 g/dL — ABNORMAL LOW (ref 12.0–15.0)
MCH: 28.4 pg (ref 26.0–34.0)
MCHC: 33.6 g/dL (ref 30.0–36.0)
MCV: 84.5 fL (ref 80.0–100.0)
Platelets: 151 10*3/uL (ref 150–400)
RBC: 3.28 MIL/uL — ABNORMAL LOW (ref 3.87–5.11)
RDW: 13.2 % (ref 11.5–15.5)
WBC: 8.1 10*3/uL (ref 4.0–10.5)
nRBC: 0 % (ref 0.0–0.2)

## 2023-03-29 LAB — CREATININE, SERUM
Creatinine, Ser: 0.9 mg/dL (ref 0.44–1.00)
GFR, Estimated: >60 mL/min (ref >60)

## 2023-03-29 MED ORDER — POLYSACCHARIDE IRON COMPLEX 150 MG PO CAPS
150.0000 mg | ORAL_CAPSULE | Freq: Every day | ORAL | Status: DC
  Administered 2023-03-29 – 2023-03-30 (×2): 150 mg via ORAL
  Filled 2023-03-29 (×3): qty 1

## 2023-03-29 NOTE — Progress Notes (Signed)
Diana Higgins [id number] Postpartum Postoperative Day # 1  Diana Higgins, [id number], [redacted]w[redacted]d, S/P Repeat LT Cesarean Section due to pt was admitted  for RCS on 2/1 with h/o cholestasis during this pregnancy was on ursodiol during pregnancy, slight elevated ALT 47 but unremarkable ast, PCR 0.23, also had elevated BP but did not meet criteria for GHTN, BP currently 117/53. No meds. Went to OR with Dr Sallye Ober on 2/1 for repeat CS under spinal, qbl was PPH with , was given 2 unit RBC Post op hgb from 11-8.6 then 2 unit RCS then 4 hour post was 11 then next morning 9.3 and place on PO iron. BMI 45 on lovenox for prophylactics. .   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   Elevated liver enzymes 12/02/2022   High serum bile acid 12/02/2022   Abnormal maternal serum screening test 12/02/2022   Obesity affecting pregnancy, antepartum 11/03/2022   History of cholestasis during pregnancy 11/03/2022   Short interval between pregnancies affecting pregnancy, antepartum- del 03/27/22 C/S 11/03/2022   Status post primary low transverse cesarean section 03/27/2022     Active Ambulatory Problems    Diagnosis Date Noted   Status post primary low transverse cesarean section 03/27/2022   Obesity affecting pregnancy, antepartum 11/03/2022   History of cholestasis during pregnancy 11/03/2022   Short interval between pregnancies affecting pregnancy, antepartum- del 03/27/22 C/S 11/03/2022   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   Elevated liver enzymes 12/02/2022   High serum bile acid 12/02/2022   Abnormal maternal serum screening test 12/02/2022   Resolved Ambulatory Problems    Diagnosis Date Noted    Encounter for induction of labor 03/25/2022   Arrest of dilation, delivered, current hospitalization 03/27/2022   Gestational hypertension w/o significant proteinuria in 3rd trimester 03/27/2022   PPH (postpartum hemorrhage) 03/28/2022   Acute blood loss anemia (ABLA) 03/28/2022   Past Medical History:  Diagnosis Date   Cholestasis during pregnancy    Gallstones    Heart murmur    Vaginal Pap smear, abnormal      Subjective: Patient up ad lib, denies syncope or dizziness. Reports consuming regular diet without issues and denies N/V. Patient reports 0 bowel movement + passing flatus.  Denies issues with urination and reports bleeding is "lighter."  Patient is breastfeeding and reports going well.  Desires undecided, possible vasectomy for postpartum contraception.  Pain is being appropriately managed with use of po meds. Denies HA, RUQ pain or vision changes, endorses mild itching from anesthesia and being given benadryl.    Objective: Patient Vitals for the past 24 hrs:  BP Temp Temp src Pulse Resp SpO2  03/29/23 0445 (!) 117/53 -- -- (!) 58 16 98 %  03/29/23 0000 124/79 98.1 F (36.7 C) Axillary (!) 55 16 98 %  03/28/23 2000 129/80 97.9 F (36.6 C) Oral 66 18 98 %  03/28/23 1800 128/80 98 F (36.7 C) Oral 69 18 97 %  03/28/23 1700 128/84 98.2 F (36.8 C) Oral 65 18 97 %  03/28/23 1555 126/79 98 F (36.7 C) Oral (!) 59 16 97 %  03/28/23 1540 136/87 97.7 F (36.5 C) Oral 60 16 97 %  03/28/23 1530 139/80 -- -- 77 15  97 %  03/28/23 1522 138/75 -- -- 77 14 97 %  03/28/23 1514 (!) 88/60 -- -- (!) 54 16 97 %  03/28/23 1505 119/64 -- -- 69 14 96 %  03/28/23 1459 123/69 (!) 96.6 F (35.9 C) Temporal 70 18 97 %  03/28/23 1445 (!) 153/56 -- -- 78 (!) 22 99 %  03/28/23 1430 (!) 137/93 -- -- 70 16 98 %  03/28/23 1426 131/79 (!) 96.5 F (35.8 C) Temporal (!) 53 13 94 %  03/28/23 1415 114/83 (!) 97.2 F (36.2 C) Temporal 62 14 96 %  03/28/23 1409 (!) 95/53 (!) 96.8 F (36 C)  Temporal 70 16 --  03/28/23 1400 (!) 95/53 (!) 96.8 F (36 C) Temporal 70 16 97 %  03/28/23 1352 (!) 148/99 -- -- 99 14 100 %  03/28/23 1345 (!) 145/94 (!) 96.7 F (35.9 C) Temporal 61 15 98 %     Physical Exam:  General: alert, cooperative, and appears stated age Mood/Affect: happy Lungs: clear to auscultation, no wheezes, rales or rhonchi, symmetric air entry.  Heart: normal rate, regular rhythm, normal S1, S2, no murmurs, rubs, clicks or gallops. Breast: breasts appear normal, no suspicious masses, no skin or nipple changes or axillary nodes. Abdomen:  + bowel sounds, soft, non-tender Incision: healing well, no significant drainage, no dehiscence, Honeycomb dressing  Uterine Fundus: firm, involution -1 Lochia: appropriate Skin: Warm, Dry. DVT Evaluation: No evidence of DVT seen on physical exam. Negative Homan's sign. No cords or calf tenderness. No significant calf/ankle edema.  Labs: Recent Labs    03/28/23 1640 03/28/23 2018 03/29/23 0430  HGB 11.0* 11.0* 9.3*  HCT 33.8* 32.1* 27.7*  WBC 10.0 10.1 8.1    CBG (last 3)  No results for input(s): "GLUCAP" in the last 72 hours.   I/O: I/O last 3 completed shifts: In: 7189.5 [I.V.:3000; Blood:3189.5; IV Piggyback:1000] Out: 3159 [Urine:1025; Blood:2134]   Assessment Postpartum Postoperative Day # 1  Diana Higgins, [phone], [redacted]w[redacted]d, S/P Repeat LT Cesarean Section due to pt was admitted  for RCS on 2/1 with h/o cholestasis during this pregnancy was on ursodiol during pregnancy, slight elevated ALT 47 but unremarkable ast, PCR 0.23, also had elevated BP but did not meet criteria for GHTN, BP currently 117/53. No meds. Went to OR with Dr Sallye Ober on 2/1 for repeat CS under spinal, qbl was PPH with , was given 2 unit RBC Post op hgb from 11-8.6 then 2 unit RCS then 4 hour post was 11 then next morning 9.3 and place on PO iron. BMI 45 on lovenox for prophylactics.  Pt stable. -1 Involution. Breast Feeding. Hemodynamically  Stable.  Plan: Continue other mgmt as ordered Monitor BP for GHTN dx. Anemia ABLA: PO Iron  VTE Prophylactics: SCD, and Lovenox, ambulated as tolerates.  Pain control: Motrin/Tylenol/Narcotics PRN Education given regarding options for contraception, including barrier methods, injectable contraception, IUD placement, oral contraceptives.  Plan for discharge tomorrow, Breastfeeding, Lactation consult, and Circumcision prior to discharge  Dr. Sallye Ober to be updated on patient status  [facility] CNM, FNP-C, PMHNP-BC  [id number]  [place], [place] [address]  Cell: [phone]  Office Phone: [phone] Fax: [phone] 03/29/2023  10:38 AM

## 2023-03-29 NOTE — Lactation Note (Signed)
This note was copied from a baby's chart. Lactation Consultation Note  Patient Name: Diana Higgins Date: 03/29/2023 Age:29 hours Reason for consult: Mother's request;Term Mom had questions about baby feeding so much. Every hour and yesterday fed every 2-3 hrs. Newborn feeding habits, behavior, STS, I&O reviewed. Baby has had above normal out put so explained to mom the baby is getting something. Baby was laying on mom's chest sleeping. She stated the baby will not let her put him down and she can't rest. Mom showed LC a picture of what she pumped w/her now 28 yr old which she had to pump because he went to NICU. Mom was an over Photographer but 3rd day. Mom wondering why she can't pump that much now. LC stated if baby is on the breast every hour and has as much output as he does then he is getting that milk from your breast. Mom stated that made her feel better. She was worried because he was acting hungry all the time that he wasn't getting enough. Praised mom for great feedings.  Encouraged mom to call for any further questions or concerns.   Maternal Data    Feeding    LATCH Score                    Lactation Tools Discussed/Used    Interventions    Discharge    Consult Status Consult Status: Follow-up Date: 03/30/23 Follow-up type: In-patient    Charyl Dancer 03/29/2023, 8:46 PM

## 2023-03-30 MED ORDER — ACETAMINOPHEN 500 MG PO TABS: 1000.0000 mg | ORAL_TABLET | Freq: Four times a day (QID) | ORAL | 0 refills | Status: DC

## 2023-03-30 MED ORDER — OXYCODONE HCL 5 MG PO TABS: 5.0000 mg | ORAL_TABLET | ORAL | 0 refills | Status: DC | PRN

## 2023-03-30 MED ORDER — IBUPROFEN 600 MG PO TABS: 600.0000 mg | ORAL_TABLET | Freq: Four times a day (QID) | ORAL | 0 refills | Status: DC

## 2023-03-30 NOTE — Progress Notes (Signed)
Patient requested donor milk none was available.

## 2023-03-30 NOTE — Discharge Summary (Signed)
Postpartum Discharge Summary  Date of Service updated2/3/25     Patient Name: Diana Higgins DOB: 11-Jun-1995 MRN: [id number]  Date of admission: 03/28/2023 Delivery date:03/28/2023 Delivering provider: Hoover Browns Date of discharge: 03/30/2023  Admitting diagnosis: Cesarean delivery delivered [O82] Other specified postprocedural states [Z98.890] Intrauterine pregnancy: [redacted]w[redacted]d     Secondary diagnosis:  Principal Problem:   Cesarean delivery delivered Active Problems:   Other specified postprocedural states   Acute blood loss anemia   Cholestasis during pregnancy   Elevated BP without diagnosis of hypertension   Postpartum hemorrhage  Additional problems: see above    Discharge diagnosis: Term Pregnancy Delivered, Gestational Hypertension, and cholestasis                                               Post partum procedures: na Augmentation: N/A Complications: None  Hospital course: Sceduled C/S   28 y.o. yo [id number] at [redacted]w[redacted]d was admitted to the hospital 03/28/2023 for scheduled cesarean section with the following indication:Elective Repeat.Delivery details are as follows:  Membrane Rupture Time/Date: 11:46 AM,03/28/2023  Delivery Method:C-Section, Vacuum Assisted Operative Delivery:Device used:vacuum  Indication: Fetal indications Details of operation can be found in separate operative note.  Patient had a postpartum course complicated bynothing .  She is ambulating, tolerating a regular diet, passing flatus, and urinating well. Patient is discharged home in stable condition on  03/30/23        Newborn Data: Birth date:03/28/2023 Birth time:11:52 AM Gender:Female Living status:Living Apgars:7 ,8  Weight:3920 g    Magnesium Sulfate received: No BMZ received: No Rhophylac:No MMR:No T-DaP:   Flu: N/A RSV Vaccine received: No Transfusion:No Immunizations administered:  There is no immunization history on file for this patient.  Physical exam  Vitals:   03/29/23 1244 03/29/23  2136 03/30/23 0527 03/30/23 1348  BP:  135/85 131/68 125/79  Pulse:  86 73 74  Resp: 16 18 18 16   Temp: 98.6 F (37 C) 98.9 F (37.2 C) 98.5 F (36.9 C) 98.3 F (36.8 C)  TempSrc: Axillary Oral Oral Oral  SpO2: 97% 99% 99% 99%  Weight:      Height:       General: alert and cooperative Lochia: appropriate Uterine Fundus: firm Incision: Healing well with no significant drainage, No significant erythema, Dressing is clean, dry, and intact DVT Evaluation: Negative Homan's sign. Labs: Lab Results  Component Value Date   WBC 8.1 03/29/2023   HGB 9.3 (L) 03/29/2023   HCT 27.7 (L) 03/29/2023   MCV 84.5 03/29/2023   PLT 151 03/29/2023      Latest Ref Rng & Units 03/29/2023    4:30 AM  CMP  Creatinine 0.44 - 1.00 mg/dL 1.47    Edinburgh Score:    03/29/2023    8:00 PM  Edinburgh Postnatal Depression Scale Screening Tool  I have been able to laugh and see the funny side of things. 0  I have looked forward with enjoyment to things. 0  I have blamed myself unnecessarily when things went wrong. 1  I have been anxious or worried for no good reason. 1  I have felt scared or panicky for no good reason. 1  Things have been getting on top of me. 0  I have been so unhappy that I have had difficulty sleeping. 0  I have felt sad or miserable. 0  I have  been so unhappy that I have been crying. 0  The thought of harming myself has occurred to me. 0  Edinburgh Postnatal Depression Scale Total 3      After visit meds:  Allergies as of 03/30/2023       Reactions   Latex    Pt prefers no latex, her partner is allergic        Medication List     STOP taking these medications    aspirin EC 81 MG tablet   PRENATAL DHA PO   ursodiol 300 MG capsule Commonly known as: ACTIGALL       TAKE these medications    acetaminophen 500 MG tablet Commonly known as: TYLENOL Take 2 tablets (1,000 mg total) by mouth every 6 (six) hours.   ferrous sulfate 325 (65 FE) MG tablet Take 325  mg by mouth every other day.   ibuprofen 600 MG tablet Commonly known as: ADVIL Take 1 tablet (600 mg total) by mouth every 6 (six) hours. Start taking on: March 31, 2023   oxyCODONE 5 MG immediate release tablet Commonly known as: Oxy IR/ROXICODONE Take 1-2 tablets (5-10 mg total) by mouth every 4 (four) hours as needed for moderate pain (pain score 4-6).   pantoprazole 40 MG tablet Commonly known as: PROTONIX Take 40 mg by mouth daily as needed (acid reflux).   prenatal multivitamin Tabs tablet Take 1 tablet by mouth daily at 12 noon.   Vitamin D 50 MCG (2000 UT) tablet Take 4,000 Units by mouth daily.         Discharge home in stable condition Infant Feeding: Breast Infant Disposition:home with mother Discharge instruction: per After Visit Summary and Postpartum booklet. Activity: Advance as tolerated. Pelvic rest for 6 weeks.  Diet: routine diet Anticipated Birth Control: Unsure Postpartum Appointment:2 weeks Additional Postpartum F/U: Incision check   Future Appointments:No future appointments. Follow up Visit:  Follow-up Information     Hoover Browns, MD Follow up in 2 week(s).   Specialty: Obstetrics and Gynecology Contact information: [address] [place] [address] [phone]                     03/30/2023 Michael Litter, MD

## 2023-03-30 NOTE — Lactation Note (Signed)
This note was copied from a baby's chart. Lactation Consultation Note  Patient Name: Diana Higgins [id number] Date: 03/30/2023 Age:29 hours Reason for consult: Follow-up assessment;Exclusive pumping and bottle feeding;Term;Infant weight loss  P2- MOB reports that she has decided to switch to exclusive pumping because she prefers to be able to see the volume infant is taking in. MOB denies having any questions or concerns at this time. LC reviewed CDC milk storage guidelines, LC services handout and engorgement/breast care. LC encouraged MOB to call for further assistance as needed.  Maternal Data Has patient been taught Hand Expression?: Yes Does the patient have breastfeeding experience prior to this delivery?: Yes How long did the patient breastfeed?: 6 months  Feeding Mother's Current Feeding Choice: Breast Milk  Lactation Tools Discussed/Used Pump Education: Milk Storage  Interventions Interventions: Breast feeding basics reviewed;Education;LC Services brochure  Discharge Discharge Education: Engorgement and breast care;Warning signs for feeding baby Pump: DEBP;Manual;Hands Free;Personal  Consult Status Consult Status: Complete Date: 03/30/23    Dema Severin BS, IBCLC 03/30/2023, 8:29 PM

## 2023-03-30 NOTE — Progress Notes (Addendum)
Rn got order from provider to change patients honey come dressing.Site was intact no active bleeding at this time

## 2023-04-04 ENCOUNTER — Encounter (HOSPITAL_COMMUNITY): Payer: Self-pay | Admitting: Obstetrics and Gynecology

## 2023-04-04 ENCOUNTER — Inpatient Hospital Stay (HOSPITAL_COMMUNITY)
Admission: AD | Admit: 2023-04-04 | Discharge: 2023-04-04 | Disposition: A | Discharge status: Home / Self Care | Payer: Medicaid Other | Attending: Obstetrics and Gynecology | Admitting: Obstetrics and Gynecology

## 2023-04-04 ENCOUNTER — Inpatient Hospital Stay (HOSPITAL_COMMUNITY)
Admission: AD | Admit: 2023-04-04 | Discharge: 2023-04-06 | DRG: 776 | Disposition: A | Discharge status: Home / Self Care | Payer: Medicaid Other | Source: Ambulatory Visit | Attending: Obstetrics & Gynecology | Admitting: Obstetrics & Gynecology

## 2023-04-04 DIAGNOSIS — O2663 Liver and biliary tract disorders in the puerperium: Secondary | ICD-10-CM | POA: Diagnosis present

## 2023-04-04 DIAGNOSIS — O1415 Severe pre-eclampsia, complicating the puerperium: Secondary | ICD-10-CM | POA: Diagnosis present

## 2023-04-04 DIAGNOSIS — R0789 Other chest pain: Secondary | ICD-10-CM | POA: Insufficient documentation

## 2023-04-04 DIAGNOSIS — O1495 Unspecified pre-eclampsia, complicating the puerperium: Secondary | ICD-10-CM

## 2023-04-04 DIAGNOSIS — Z8759 Personal history of other complications of pregnancy, childbirth and the puerperium: Secondary | ICD-10-CM | POA: Insufficient documentation

## 2023-04-04 DIAGNOSIS — E7879 Other disorders of bile acid and cholesterol metabolism: Secondary | ICD-10-CM | POA: Diagnosis present

## 2023-04-04 DIAGNOSIS — K7689 Other specified diseases of liver: Secondary | ICD-10-CM | POA: Diagnosis present

## 2023-04-04 DIAGNOSIS — Z7982 Long term (current) use of aspirin: Secondary | ICD-10-CM

## 2023-04-04 DIAGNOSIS — O1410 Severe pre-eclampsia, unspecified trimester: Principal | ICD-10-CM | POA: Diagnosis present

## 2023-04-04 LAB — COMPREHENSIVE METABOLIC PANEL
ALT: 83 U/L — ABNORMAL HIGH (ref 0–44)
AST: 49 U/L — ABNORMAL HIGH (ref 15–41)
Albumin: 2.8 g/dL — ABNORMAL LOW (ref 3.5–5.0)
Alkaline Phosphatase: 287 U/L — ABNORMAL HIGH (ref 38–126)
Anion gap: 15 (ref 5–15)
BUN: 15 mg/dL (ref 6–20)
CO2: 25 mmol/L (ref 22–32)
Calcium: 9.1 mg/dL (ref 8.9–10.3)
Chloride: 105 mmol/L (ref 98–111)
Creatinine, Ser: 0.99 mg/dL (ref 0.44–1.00)
GFR, Estimated: >60 mL/min (ref >60)
Glucose, Bld: 82 mg/dL (ref 70–99)
Potassium: 4.5 mmol/L (ref 3.5–5.1)
Sodium: 145 mmol/L (ref 135–145)
Total Bilirubin: 0.7 mg/dL (ref 0.0–1.2)
Total Protein: 6.1 g/dL — ABNORMAL LOW (ref 6.5–8.1)

## 2023-04-04 LAB — CBC
HCT: 29.9 % — ABNORMAL LOW (ref 36.0–46.0)
Hemoglobin: 9.4 g/dL — ABNORMAL LOW (ref 12.0–15.0)
MCH: 27.5 pg (ref 26.0–34.0)
MCHC: 31.4 g/dL (ref 30.0–36.0)
MCV: 87.4 fL (ref 80.0–100.0)
Platelets: 294 10*3/uL (ref 150–400)
RBC: 3.42 MIL/uL — ABNORMAL LOW (ref 3.87–5.11)
RDW: 13.5 % (ref 11.5–15.5)
WBC: 6.7 10*3/uL (ref 4.0–10.5)
nRBC: 0 % (ref 0.0–0.2)

## 2023-04-04 LAB — MAGNESIUM: Magnesium: 1.9 mg/dL (ref 1.7–2.4)

## 2023-04-04 LAB — BRAIN NATRIURETIC PEPTIDE: B Natriuretic Peptide: 132.5 pg/mL — ABNORMAL HIGH (ref 0.0–100.0)

## 2023-04-04 MED ORDER — NIFEDIPINE 10 MG PO CAPS: 20.0000 mg | ORAL_CAPSULE | ORAL | Status: DC | PRN

## 2023-04-04 MED ORDER — LABETALOL HCL 5 MG/ML IV SOLN: 40.0000 mg | INTRAVENOUS | Status: DC | PRN

## 2023-04-04 MED ORDER — LACTATED RINGERS IV SOLN: INTRAVENOUS | Status: AC

## 2023-04-04 MED ORDER — NIFEDIPINE 10 MG PO CAPS: 10.0000 mg | ORAL_CAPSULE | ORAL | Status: DC | PRN

## 2023-04-04 MED ORDER — MAGNESIUM SULFATE 40 GM/1000ML IV SOLN
2.0000 g/h | INTRAVENOUS | Status: AC
  Administered 2023-04-05 (×2): 2 g/h via INTRAVENOUS
  Filled 2023-04-04: qty 1000

## 2023-04-04 MED ORDER — NIFEDIPINE ER OSMOTIC RELEASE 30 MG PO TB24: 30.0000 mg | ORAL_TABLET | Freq: Every day | ORAL | Status: DC

## 2023-04-04 MED ORDER — MAGNESIUM SULFATE BOLUS VIA INFUSION
4.0000 g | Freq: Once | INTRAVENOUS | Status: AC
  Administered 2023-04-05: 4 g via INTRAVENOUS
  Filled 2023-04-04: qty 1000

## 2023-04-04 NOTE — MAU Provider Note (Signed)
 History     CSN: [id number]  Arrival date and time: 04/04/23 1906   Event Date/Time   First Provider Initiated Contact with Patient 04/04/23 2012      Chief Complaint  Patient presents with   Hypertension   Leg Swelling   HPI Diana Higgins is 28 y.o. female who present for hypertension. She is 1 week s/p repeat c/section. Has history of postpartum hypertension with her last pregnancy. Took her BP today due to increased LE swelling. Denies headache, visual disturbance, or epigastric pain. Reports occasional chest tightness but denies CP or SOB.   OB History     Gravida  3   Para  2   Term  2   Preterm      AB  1   Living  2      SAB      IAB      Ectopic      Multiple  0   Live Births  2           Past Medical History:  Diagnosis Date   Cholestasis during pregnancy    Gallstones    Gestational hypertension w/o significant proteinuria in 3rd trimester 03/27/2022   Heart murmur    since birth per pt   PPH (postpartum hemorrhage) 03/28/2022   Vaginal Pap smear, abnormal     Past Surgical History:  Procedure Laterality Date   CESAREAN SECTION N/A 03/27/2022   Procedure: CESAREAN SECTION;  Surgeon: Storm Setter, DO;  Location: [facility];  Service: Obstetrics;  Laterality: N/A;   CESAREAN SECTION N/A 03/28/2023   Procedure: CESAREAN SECTION;  Surgeon: Gloriann Chick, MD;  Location: [facility];  Service: Obstetrics;  Laterality: N/A;   INDUCED ABORTION      Family History  Problem Relation Age of Onset   Kidney disease Father    Hypertension Father    Breast cancer Maternal Grandmother    Kidney disease Paternal Grandmother    Hypertension Paternal Grandmother     Social History   Tobacco Use   Smoking status: Never   Smokeless tobacco: Never  Vaping Use   Vaping status: Never Used  Substance Use Topics   Alcohol use: Never   Drug use: Never    Allergies:  Allergies  Allergen Reactions   Latex     Pt prefers no latex, her partner is  allergic    Medications Prior to Admission  Medication Sig Dispense Refill Last Dose/Taking   acetaminophen  (TYLENOL ) 500 MG tablet Take 2 tablets (1,000 mg total) by mouth every 6 (six) hours. 30 tablet 0 04/04/2023 Noon   aspirin EC 81 MG tablet Take 81 mg by mouth daily. Swallow whole.   04/04/2023 Noon   Cholecalciferol  (VITAMIN D ) 50 MCG (2000 UT) tablet Take 4,000 Units by mouth daily.   04/04/2023 Noon   ferrous sulfate  325 (65 FE) MG tablet Take 325 mg by mouth every other day.   Past Week   ibuprofen  (ADVIL ) 600 MG tablet Take 1 tablet (600 mg total) by mouth every 6 (six) hours. 30 tablet 0 04/04/2023 Noon   oxyCODONE  (OXY IR/ROXICODONE ) 5 MG immediate release tablet Take 1-2 tablets (5-10 mg total) by mouth every 4 (four) hours as needed for moderate pain (pain score 4-6). 30 tablet 0 04/03/2023   pantoprazole (PROTONIX) 40 MG tablet Take 40 mg by mouth daily as needed (acid reflux).   Past Week   Prenatal Vit-Fe Fumarate-FA (PRENATAL MULTIVITAMIN) TABS tablet Take 1 tablet by mouth  daily at 12 noon.   04/04/2023 Noon    Review of Systems  All other systems reviewed and are negative.  Physical Exam   Blood pressure (!) 151/82, pulse (!) 44, temperature 98.1 F (36.7 C), temperature source Oral, resp. rate 17, height 5' 4 (1.626 m), weight 112.9 kg, last menstrual period 06/26/2022, SpO2 99%, currently breastfeeding. Patient Vitals for the past 24 hrs:  BP Temp Temp src Pulse Resp SpO2 Height Weight  04/04/23 2102 (!) 151/82 -- -- (!) 44 -- 99 % -- --  04/04/23 2046 (!) 150/86 -- -- (!) 52 -- -- -- --  04/04/23 2045 -- -- -- -- -- 99 % -- --  04/04/23 2036 (!) 149/62 -- -- (!) 42 -- -- -- --  04/04/23 2025 -- -- -- -- -- 99 % -- --  04/04/23 2015 (!) 151/99 -- -- (!) 48 -- 99 % -- --  04/04/23 1946 (!) 166/88 98.1 F (36.7 C) Oral (!) 55 17 99 % 5' 4 (1.626 m) 112.9 kg     Physical Exam Vitals and nursing note reviewed.  Constitutional:      General: She is not in acute  distress.    Appearance: She is well-developed. She is not ill-appearing.  HENT:     Head: Normocephalic and atraumatic.  Eyes:     General: No scleral icterus.       Right eye: No discharge.        Left eye: No discharge.     Conjunctiva/sclera: Conjunctivae normal.  Cardiovascular:     Rate and Rhythm: Bradycardia present.  Pulmonary:     Effort: Pulmonary effort is normal. No respiratory distress.  Musculoskeletal:     Right lower leg: 2+ Pitting Edema present.     Left lower leg: 2+ Pitting Edema present.  Neurological:     General: No focal deficit present.     Mental Status: She is alert.     Deep Tendon Reflexes:     Reflex Scores:      Patellar reflexes are 2+ on the right side and 2+ on the left side.    Comments: No clonus  Psychiatric:        Mood and Affect: Mood normal.        Behavior: Behavior normal.     MAU Course  Procedures Results for orders placed or performed during the hospital encounter of 04/04/23 (from the past 24 hours)  CBC     Status: Abnormal   Collection Time: 04/04/23  7:55 PM  Result Value Ref Range   WBC 6.7 4.0 - 10.5 K/uL   RBC 3.42 (L) 3.87 - 5.11 MIL/uL   Hemoglobin 9.4 (L) 12.0 - 15.0 g/dL   HCT 70.0 (L) 63.9 - 53.9 %   MCV 87.4 80.0 - 100.0 fL   MCH 27.5 26.0 - 34.0 pg   MCHC 31.4 30.0 - 36.0 g/dL   RDW 86.4 88.4 - 84.4 %   Platelets 294 150 - 400 K/uL   nRBC 0.0 0.0 - 0.2 %  Comprehensive metabolic panel     Status: Abnormal   Collection Time: 04/04/23  7:55 PM  Result Value Ref Range   Sodium 145 135 - 145 mmol/L   Potassium 4.5 3.5 - 5.1 mmol/L   Chloride 105 98 - 111 mmol/L   CO2 25 22 - 32 mmol/L   Glucose, Bld 82 70 - 99 mg/dL   BUN 15 6 - 20 mg/dL   Creatinine, Ser 9.00 0.44 -  1.00 mg/dL   Calcium 9.1 8.9 - 89.6 mg/dL   Total Protein 6.1 (L) 6.5 - 8.1 g/dL   Albumin  2.8 (L) 3.5 - 5.0 g/dL   AST 49 (H) 15 - 41 U/L   ALT 83 (H) 0 - 44 U/L   Alkaline Phosphatase 287 (H) 38 - 126 U/L   Total Bilirubin 0.7 0.0 -  1.2 mg/dL   GFR, Estimated >39 >39 mL/min   Anion gap 15 5 - 15  Magnesium      Status: None   Collection Time: 04/04/23  7:55 PM  Result Value Ref Range   Magnesium  1.9 1.7 - 2.4 mg/dL      Assessment and Plan   1. Preeclampsia in postpartum period    -SRBP x 1. Persistent elevated BPs. Asymptomatic but elevate BPs consistent with severe preeclampsia. Reviewed with Dr. Letha who will admit patient for magnesium .  -Patient discharged home to pack belongings & take carseats to her house for her husband. She will return later this evening for direct admission. Given she has not had persistent SRBPs & is aware of risks of severe preeclampsia/eclampsia, will place discharge orders. Dr. Letha aware of plan.    Rocky Satterfield 04/04/2023, 9:23 PM

## 2023-04-04 NOTE — MAU Note (Signed)
.  Diana Higgins is a 28 y.o. at [redacted]w[redacted]d Repeat CS 03/28/2023 here in MAU reporting: over last 3-4 days increased swelling in feet and legs Denies headache, visual changes or epigastric pain Reports tightness at upper sternal area Does have occasional unproductive cough BP at home  164/105 at 1830 Is not taking antihypertensive  Pain score: None Vitals:   04/04/23 1946  BP: (!) 166/88  Pulse: (!) 55  Resp: 17  Temp: 98.1 F (36.7 C)  SpO2: 99%      Lab orders placed from triage:

## 2023-04-04 NOTE — H&P (Addendum)
 ANTEPARTUM ADMISSION HISTORY AND PHYSICAL NOTE   History of Present Illness: Diana Higgins is a 28 y.o. G3P2012 x1 wk PP from a repeat C/s who is being admitted due to concern for postpartum preeclampsia with severe features. She was seen in the MAU this evening for increasing LE swelling and elevated blood pressure readings; 1 severe range. Her labs subsequently showed elevated ALT x2 the upper limit of normal at 83. LFTs were mildly elevated in pregnancy with an elevated bile acid; she was diagnosed with ICP and taking ursodiol. She has both a hx of ICP and postpartum hypertension with her previous pregnancy. She admits to having a headache on admission that has since resolved thought to be due to hunger. Otherwise, she denies vision changes and RUQ pain. She endorses LE swelling that has continued to increase of the last week. She feels her skin around her ankles are tight when flexing. She also admit to continued occasional diffuse itching as well as itching of hands and feet. She states she was being treated for ICP and ran out of her medications a week before delivery. She states overall she feels well.    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   Elevated liver enzymes 12/02/2022   High serum bile acid 12/02/2022   Abnormal maternal serum screening test 12/02/2022   Obesity affecting pregnancy, antepartum 11/03/2022   History of cholestasis during pregnancy 11/03/2022   Short interval between pregnancies affecting pregnancy, antepartum- del 03/27/22 C/S 11/03/2022   Status post primary low transverse cesarean section 03/27/2022    Past Medical  History:  Diagnosis Date   Cholestasis during pregnancy    Gallstones    Gestational hypertension w/o significant proteinuria in 3rd trimester 03/27/2022   Heart murmur    since birth per pt   PPH (postpartum hemorrhage) 03/28/2022   Vaginal Pap smear, abnormal     Past Surgical History:  Procedure Laterality Date   CESAREAN SECTION N/A 03/27/2022   Procedure: CESAREAN SECTION;  Surgeon: Storm Setter, DO;  Location: [facility];  Service: Obstetrics;  Laterality: N/A;   CESAREAN SECTION N/A 03/28/2023   Procedure: CESAREAN SECTION;  Surgeon: Gloriann Chick, MD;  Location: [facility];  Service: Obstetrics;  Laterality: N/A;   INDUCED ABORTION      OB History  Gravida Para Term Preterm AB Living  3 2 2  1 2   SAB IAB Ectopic Multiple Live Births     0 2    # Outcome Date GA Lbr Len/2nd Weight Sex Type Anes PTL Lv  3 Term 03/28/23 [redacted]w[redacted]d  3920 g M CS-Vac Spinal  LIV  2 Term 03/27/22 [redacted]w[redacted]d  3170 g M CS-LTranv EPI  LIV  1 AB 2015            Social History   Socioeconomic History   Marital status: Single    Spouse name: Not on file   Number of children: Not on file   Years of education: Not on file   Highest education level: Not on file  Occupational History   Not on file  Tobacco Use   Smoking status: Never   Smokeless tobacco: Never  Vaping Use  Vaping status: Never Used  Substance and Sexual Activity   Alcohol use: Never   Drug use: Never   Sexual activity: Yes  Other Topics Concern   Not on file  Social History Narrative   Not on file   Social Drivers of Health   Financial Resource Strain: Not on file  Food Insecurity: No Food Insecurity (04/04/2023)   Hunger Vital Sign    Worried About Running Out of Food in the Last Year: Never true    Ran Out of Food in the Last Year: Never true  Transportation Needs: No Transportation Needs (04/04/2023)   PRAPARE - Administrator, Civil Service (Medical): No    Lack of Transportation (Non-Medical): No  Physical  Activity: Not on file  Stress: Not on file  Social Connections: Unknown (04/04/2023)   Social Connection and Isolation Panel [NHANES]    Frequency of Communication with Friends and Family: More than three times a week    Frequency of Social Gatherings with Friends and Family: Twice a week    Attends Religious Services: Patient declined    Database Administrator or Organizations: Patient declined    Attends Engineer, Structural: Patient declined    Marital Status: Living with partner    Family History  Problem Relation Age of Onset   Kidney disease Father    Hypertension Father    Breast cancer Maternal Grandmother    Kidney disease Paternal Grandmother    Hypertension Paternal Grandmother     Allergies  Allergen Reactions   Latex     Pt prefers no latex, her partner is allergic    Medications Prior to Admission  Medication Sig Dispense Refill Last Dose/Taking   acetaminophen  (TYLENOL ) 500 MG tablet Take 2 tablets (1,000 mg total) by mouth every 6 (six) hours. 30 tablet 0    aspirin EC 81 MG tablet Take 81 mg by mouth daily. Swallow whole.      Cholecalciferol  (VITAMIN D ) 50 MCG (2000 UT) tablet Take 4,000 Units by mouth daily.      ferrous sulfate  325 (65 FE) MG tablet Take 325 mg by mouth every other day.      ibuprofen  (ADVIL ) 600 MG tablet Take 1 tablet (600 mg total) by mouth every 6 (six) hours. 30 tablet 0    oxyCODONE  (OXY IR/ROXICODONE ) 5 MG immediate release tablet Take 1-2 tablets (5-10 mg total) by mouth every 4 (four) hours as needed for moderate pain (pain score 4-6). 30 tablet 0    pantoprazole (PROTONIX) 40 MG tablet Take 40 mg by mouth daily as needed (acid reflux).      Prenatal Vit-Fe Fumarate-FA (PRENATAL MULTIVITAMIN) TABS tablet Take 1 tablet by mouth daily at 12 noon.       Vitals:  BP (!) 149/76   Pulse 64   LMP 06/26/2022   SpO2 98%  Physical Examination: General: Appears well, no acute distress. Age appropriate. Cardiac: RRR, normal heart  sounds, no murmurs Respiratory: CTAB, normal effort Abdomen: soft, nontender, nondistended Incision: Steri-strips over incision. Appears to be healing well without signs of infection Extremities: 2+ pitting edema from patella to feet Skin: Warm and dry, Left forearm bruising appears to be from prior blood draw Neuro: alert and oriented, no focal deficits Psych: normal affect  Labs:  Results for orders placed or performed during the hospital encounter of 04/04/23 (from the past 24 hours)  CBC   Collection Time: 04/04/23  7:55 PM  Result Value Ref Range  WBC 6.7 4.0 - 10.5 K/uL   RBC 3.42 (L) 3.87 - 5.11 MIL/uL   Hemoglobin 9.4 (L) 12.0 - 15.0 g/dL   HCT 70.0 (L) 63.9 - 53.9 %   MCV 87.4 80.0 - 100.0 fL   MCH 27.5 26.0 - 34.0 pg   MCHC 31.4 30.0 - 36.0 g/dL   RDW 86.4 88.4 - 84.4 %   Platelets 294 150 - 400 K/uL   nRBC 0.0 0.0 - 0.2 %  Comprehensive metabolic panel   Collection Time: 04/04/23  7:55 PM  Result Value Ref Range   Sodium 145 135 - 145 mmol/L   Potassium 4.5 3.5 - 5.1 mmol/L   Chloride 105 98 - 111 mmol/L   CO2 25 22 - 32 mmol/L   Glucose, Bld 82 70 - 99 mg/dL   BUN 15 6 - 20 mg/dL   Creatinine, Ser 9.00 0.44 - 1.00 mg/dL   Calcium 9.1 8.9 - 89.6 mg/dL   Total Protein 6.1 (L) 6.5 - 8.1 g/dL   Albumin  2.8 (L) 3.5 - 5.0 g/dL   AST 49 (H) 15 - 41 U/L   ALT 83 (H) 0 - 44 U/L   Alkaline Phosphatase 287 (H) 38 - 126 U/L   Total Bilirubin 0.7 0.0 - 1.2 mg/dL   GFR, Estimated >39 >39 mL/min   Anion gap 15 5 - 15  Brain natriuretic peptide   Collection Time: 04/04/23  7:55 PM  Result Value Ref Range   B Natriuretic Peptide 132.5 (H) 0.0 - 100.0 pg/mL  Magnesium    Collection Time: 04/04/23  7:55 PM  Result Value Ref Range   Magnesium  1.9 1.7 - 2.4 mg/dL    Imaging Studies: No results found.   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   Elevated liver enzymes 12/02/2022   High serum bile acid 12/02/2022   Abnormal maternal serum screening test 12/02/2022   Obesity affecting pregnancy, antepartum 11/03/2022   History of cholestasis during pregnancy 11/03/2022   Short interval between pregnancies affecting pregnancy, antepartum- del 03/27/22 C/S 11/03/2022   Status post primary low transverse cesarean section 03/27/2022   Admit to [facility] specialty floor Concern for: Preeclampsia w/ SF- Hx of PP HTN and severe ranges reported at home (160/100 and x1 in MAU 166/88) Cholestasis (Known history and R factor calculation is suggestive)  Worsening transaminitis due to recent acetaminophen  use   - Start mag bolus followed by 2g/h consider 24 hr mag  - Start lasix  20 mg daily (LE edema + elevated BNP) - Consider starting antihypertensive if BP remains elevated with mag and lasix ; otherwise BP protocol ordered - AM CBC/CMP/Mag lvl- if LFTs continue to increase consider the need for RUQ ultrasound IP v. OP - Ibuprofen , oxycodone  PRN; avoid tylenol  for now - Continue PNV, oral iron  - Continue to monitor BP and HR (bradycardia in MAU; now resolved)  Rojean Sacramento, DO [facility] 04/05/2023, 12:01 AM

## 2023-04-05 ENCOUNTER — Other Ambulatory Visit: Payer: Self-pay

## 2023-04-05 LAB — CBC
HCT: 29.7 % — ABNORMAL LOW (ref 36.0–46.0)
Hemoglobin: 9.5 g/dL — ABNORMAL LOW (ref 12.0–15.0)
MCH: 27.9 pg (ref 26.0–34.0)
MCHC: 32 g/dL (ref 30.0–36.0)
MCV: 87.1 fL (ref 80.0–100.0)
Platelets: 312 10*3/uL (ref 150–400)
RBC: 3.41 MIL/uL — ABNORMAL LOW (ref 3.87–5.11)
RDW: 13.6 % (ref 11.5–15.5)
WBC: 6.7 10*3/uL (ref 4.0–10.5)
nRBC: 0.3 % — ABNORMAL HIGH (ref 0.0–0.2)

## 2023-04-05 LAB — COMPREHENSIVE METABOLIC PANEL
ALT: 76 U/L — ABNORMAL HIGH (ref 0–44)
AST: 38 U/L (ref 15–41)
Albumin: 2.6 g/dL — ABNORMAL LOW (ref 3.5–5.0)
Alkaline Phosphatase: 263 U/L — ABNORMAL HIGH (ref 38–126)
Anion gap: 7 (ref 5–15)
BUN: 15 mg/dL (ref 6–20)
CO2: 26 mmol/L (ref 22–32)
Calcium: 8.8 mg/dL — ABNORMAL LOW (ref 8.9–10.3)
Chloride: 108 mmol/L (ref 98–111)
Creatinine, Ser: 1.19 mg/dL — ABNORMAL HIGH (ref 0.44–1.00)
GFR, Estimated: >60 mL/min (ref >60)
Glucose, Bld: 109 mg/dL — ABNORMAL HIGH (ref 70–99)
Potassium: 3.9 mmol/L (ref 3.5–5.1)
Sodium: 141 mmol/L (ref 135–145)
Total Bilirubin: 0.6 mg/dL (ref 0.0–1.2)
Total Protein: 5.8 g/dL — ABNORMAL LOW (ref 6.5–8.1)

## 2023-04-05 LAB — TYPE AND SCREEN
ABO/RH(D): O POS
Antibody Screen: NEGATIVE

## 2023-04-05 LAB — MAGNESIUM: Magnesium: 4.1 mg/dL — ABNORMAL HIGH (ref 1.7–2.4)

## 2023-04-05 MED ORDER — IBUPROFEN 600 MG PO TABS
600.0000 mg | ORAL_TABLET | Freq: Four times a day (QID) | ORAL | Status: DC
Start: 2023-04-05 — End: 2023-04-05

## 2023-04-05 MED ORDER — FUROSEMIDE 20 MG PO TABS
20.0000 mg | ORAL_TABLET | Freq: Every day | ORAL | Status: DC
  Administered 2023-04-05: 20 mg via ORAL
  Filled 2023-04-05: qty 1

## 2023-04-05 MED ORDER — ACETAMINOPHEN 500 MG PO TABS: 1000.0000 mg | ORAL_TABLET | Freq: Four times a day (QID) | ORAL | Status: DC | PRN

## 2023-04-05 MED ORDER — IBUPROFEN 600 MG PO TABS
600.0000 mg | ORAL_TABLET | Freq: Four times a day (QID) | ORAL | Status: DC | PRN
  Administered 2023-04-05 – 2023-04-06 (×3): 600 mg via ORAL
  Filled 2023-04-05 (×3): qty 1

## 2023-04-05 MED ORDER — FERROUS SULFATE 325 (65 FE) MG PO TABS
325.0000 mg | ORAL_TABLET | ORAL | Status: DC
  Administered 2023-04-05: 325 mg via ORAL
  Filled 2023-04-05: qty 1

## 2023-04-05 MED ORDER — OXYCODONE HCL 5 MG PO TABS: 5.0000 mg | ORAL_TABLET | ORAL | Status: DC | PRN

## 2023-04-05 MED ORDER — NIFEDIPINE ER OSMOTIC RELEASE 30 MG PO TB24
30.0000 mg | ORAL_TABLET | Freq: Every day | ORAL | Status: DC
  Administered 2023-04-05 – 2023-04-06 (×2): 30 mg via ORAL
  Filled 2023-04-05 (×2): qty 1

## 2023-04-05 MED ORDER — ACETAMINOPHEN 500 MG PO TABS: 1000.0000 mg | ORAL_TABLET | Freq: Four times a day (QID) | ORAL | Status: DC

## 2023-04-05 MED ORDER — MAGNESIUM SULFATE 40 GM/1000ML IV SOLN
INTRAVENOUS | Status: AC
  Filled 2023-04-05: qty 1000

## 2023-04-05 MED ORDER — PRENATAL MULTIVITAMIN CH
1.0000 | ORAL_TABLET | Freq: Every day | ORAL | Status: DC
  Administered 2023-04-05 – 2023-04-06 (×2): 1 via ORAL
  Filled 2023-04-05 (×2): qty 1

## 2023-04-05 NOTE — Plan of Care (Signed)
  Problem: Education: Goal: Knowledge of General Education information will improve Description: Including pain rating scale, medication(s)/side effects and non-pharmacologic comfort measures Outcome: Progressing   Problem: Health Behavior/Discharge Planning: Goal: Ability to manage health-related needs will improve Outcome: Progressing   Problem: Clinical Measurements: Goal: Ability to maintain clinical measurements within normal limits will improve Outcome: Progressing Goal: Will remain free from infection Outcome: Progressing Goal: Diagnostic test results will improve Outcome: Progressing Goal: Respiratory complications will improve Outcome: Progressing Goal: Cardiovascular complication will be avoided Outcome: Progressing   Problem: Clinical Measurements: Goal: Will remain free from infection Outcome: Progressing   Problem: Clinical Measurements: Goal: Diagnostic test results will improve Outcome: Progressing   Problem: Clinical Measurements: Goal: Respiratory complications will improve Outcome: Progressing   Problem: Clinical Measurements: Goal: Cardiovascular complication will be avoided Outcome: Progressing   Problem: Pain Managment: Goal: General experience of comfort will improve and/or be controlled Outcome: Progressing   Problem: Pain Managment: Goal: General experience of comfort will improve and/or be controlled Outcome: Progressing

## 2023-04-06 ENCOUNTER — Telehealth (HOSPITAL_COMMUNITY): Payer: Self-pay | Admitting: *Deleted

## 2023-04-06 ENCOUNTER — Encounter (HOSPITAL_COMMUNITY): Payer: Self-pay | Admitting: Family Medicine

## 2023-04-06 LAB — COMPREHENSIVE METABOLIC PANEL WITH GFR
ALT: 63 U/L — ABNORMAL HIGH (ref 0–44)
AST: 29 U/L (ref 15–41)
Albumin: 2.8 g/dL — ABNORMAL LOW (ref 3.5–5.0)
Alkaline Phosphatase: 274 U/L — ABNORMAL HIGH (ref 38–126)
Anion gap: 10 (ref 5–15)
BUN: 14 mg/dL (ref 6–20)
CO2: 28 mmol/L (ref 22–32)
Calcium: 8.1 mg/dL — ABNORMAL LOW (ref 8.9–10.3)
Chloride: 103 mmol/L (ref 98–111)
Creatinine, Ser: 0.99 mg/dL (ref 0.44–1.00)
GFR, Estimated: >60 mL/min
Glucose, Bld: 101 mg/dL — ABNORMAL HIGH (ref 70–99)
Potassium: 4.2 mmol/L (ref 3.5–5.1)
Sodium: 141 mmol/L (ref 135–145)
Total Bilirubin: 0.3 mg/dL (ref 0.0–1.2)
Total Protein: 6.1 g/dL — ABNORMAL LOW (ref 6.5–8.1)

## 2023-04-06 LAB — CBC
HCT: 32.6 % — ABNORMAL LOW (ref 36.0–46.0)
Hemoglobin: 10.5 g/dL — ABNORMAL LOW (ref 12.0–15.0)
MCH: 28.1 pg (ref 26.0–34.0)
MCHC: 32.2 g/dL (ref 30.0–36.0)
MCV: 87.2 fL (ref 80.0–100.0)
Platelets: 347 10*3/uL (ref 150–400)
RBC: 3.74 MIL/uL — ABNORMAL LOW (ref 3.87–5.11)
RDW: 13.6 % (ref 11.5–15.5)
WBC: 5.3 10*3/uL (ref 4.0–10.5)
nRBC: 0 % (ref 0.0–0.2)

## 2023-04-06 MED ORDER — NIFEDIPINE ER OSMOTIC RELEASE 30 MG PO TB24
30.0000 mg | ORAL_TABLET | Freq: Once | ORAL | Status: AC
  Administered 2023-04-06: 30 mg via ORAL
  Filled 2023-04-06: qty 1

## 2023-04-06 MED ORDER — NIFEDIPINE ER 30 MG PO TB24
60.0000 mg | ORAL_TABLET | Freq: Every day | ORAL | 3 refills | Status: AC
Start: 2023-04-07 — End: ?

## 2023-04-06 MED ORDER — IBUPROFEN 600 MG PO TABS: 600.0000 mg | ORAL_TABLET | Freq: Four times a day (QID) | ORAL | Status: DC | PRN

## 2023-04-06 MED ORDER — FUROSEMIDE 20 MG PO TABS
20.0000 mg | ORAL_TABLET | Freq: Two times a day (BID) | ORAL | Status: DC
  Administered 2023-04-06: 20 mg via ORAL
  Filled 2023-04-06: qty 1

## 2023-04-06 MED ORDER — IBUPROFEN 600 MG PO TABS: 600.0000 mg | ORAL_TABLET | Freq: Four times a day (QID) | ORAL | 1 refills | Status: AC | PRN

## 2023-04-06 MED ORDER — ACETAMINOPHEN 325 MG PO TABS
650.0000 mg | ORAL_TABLET | Freq: Four times a day (QID) | ORAL | Status: AC | PRN
  Administered 2023-04-06: 650 mg via ORAL
  Filled 2023-04-06: qty 2

## 2023-04-06 NOTE — Telephone Encounter (Signed)
 04/06/2023  Name: Diana Higgins MRN: [id number] DOB: 02-05-1996  Reason for Call:  Transition of Care Hospital Discharge Call  Contact Status: Patient Contact Status: Complete  Language assistant needed:          Follow-Up Questions: Do You Have Any Concerns About Your Health As You Heal From Delivery?: No Do You Have Any Concerns About Your Infants Health?: No Patient was discharged from [facility] specialty care for treatment of pp preeclampsia. Stated, "I feel fine today." Reported that she has a prescription for BP medication and is taking them as prescribed. No other questions or concerns voiced at this time. Edinburgh Postnatal Depression Scale:  In the Past 7 Days:  Patient declined, stating, "Honestly, all of my answers will be the same. I feel good emotionally."  PHQ2-9 Depression Scale:     Discharge Follow-up: Edinburgh score requires follow up?: N/A Patient was advised of the following resources:: Breastfeeding Support Group, Support Group  Post-discharge interventions: Reviewed Newborn Safe Sleep Practices  Signature Julien Odor, RN, 04/06/23, [phone]

## 2023-04-06 NOTE — Discharge Summary (Signed)
 Physician Discharge Summary  Patient ID: Diana Higgins MRN: [id number] DOB/AGE: 07/07/1995 28 y.o.  Admit date: 04/04/2023 Discharge date: 04/06/2023  Admission Diagnoses: Pre-eclampsia with SF  Discharge Diagnoses:  Principal Problem:   Severe preeclampsia   Discharged Condition: stable  Hospital Course: Admitted and received 24hrs of magnesium  sulfate and lasix  and placed on procardia  xl 60mg  daily.  Pt upset that she had to stay as long as she has and requested discharge.  BP still mildly elevated however but denied any sxs and plans to return to the office in 48hrs for a BP check and repeat CMP and CBC.  Precautions reviewed.  Pitting edema resolved.  Consults: None  Significant Diagnostic Studies: labs: ALT mildly elevated at 63, will need repeat in the office.  AST wnl and creatinine initially 1.19 at admission and normalized to 0.99 upon discharge.  Treatments: Mg SO4  Discharge Exam: Blood pressure (!) 140/90, pulse 80, temperature 98.5 F (36.9 C), temperature source Oral, resp. rate 19, last menstrual period 06/26/2022, SpO2 99%, currently breastfeeding. General appearance: alert, cooperative, and no distress Resp: unlabored breathing Cardio: regular rate and rhythm GI: soft, FF, NT Extremities: no calf tenderness, trace edema  Disposition: Discharge disposition: 01-Home or Self Care        Allergies as of 04/06/2023       Reactions   Latex    Pt prefers no latex, her partner is allergic        Medication List     STOP taking these medications    acetaminophen  500 MG tablet Commonly known as: TYLENOL    aspirin EC 81 MG tablet   ferrous sulfate  325 (65 FE) MG tablet   oxyCODONE  5 MG immediate release tablet Commonly known as: Oxy IR/ROXICODONE    pantoprazole 40 MG tablet Commonly known as: PROTONIX   prenatal multivitamin Tabs tablet   Vitamin D  50 MCG (2000 UT) tablet       TAKE these medications    ibuprofen  600 MG tablet Commonly  known as: ADVIL  Take 1 tablet (600 mg total) by mouth every 6 (six) hours as needed for fever, headache or mild pain (pain score 1-3). What changed:  when to take this reasons to take this   NIFEdipine  30 MG 24 hr tablet Commonly known as: ADALAT  CC Take 2 tablets (60 mg total) by mouth daily. Start taking on: April 07, 2023        Follow-up Information     [facility]. Go on 04/28/2023.   Specialty: Obstetrics and Gynecology Why: For Postpartum follow-up at 8:45am Contact information: [address] [place]  [phone]        [facility]. Schedule an appointment as soon as possible for a visit in 1 week(s).   Specialty: Obstetrics and Gynecology Why: for Blood Pressure Check Contact information: [address] [place]  [phone]                Signed: Madelene Schanz 04/06/2023, 9:50 PM

## 2023-04-06 NOTE — Plan of Care (Signed)
  Problem: Health Behavior/Discharge Planning: Goal: Ability to manage health-related needs will improve Outcome: Adequate for Discharge   Problem: Clinical Measurements: Goal: Ability to maintain clinical measurements within normal limits will improve Outcome: Adequate for Discharge Goal: Will remain free from infection Outcome: Adequate for Discharge Goal: Diagnostic test results will improve Outcome: Adequate for Discharge Goal: Cardiovascular complication will be avoided Outcome: Adequate for Discharge   Problem: Elimination: Goal: Will not experience complications related to bowel motility Outcome: Adequate for Discharge   Problem: Safety: Goal: Ability to remain free from injury will improve Outcome: Adequate for Discharge   Problem: Skin Integrity: Goal: Risk for impaired skin integrity will decrease Outcome: Adequate for Discharge   Problem: Education: Goal: Knowledge of disease or condition will improve Outcome: Adequate for Discharge Goal: Knowledge of the prescribed therapeutic regimen will improve Outcome: Adequate for Discharge   Problem: Fluid Volume: Goal: Peripheral tissue perfusion will improve Outcome: Adequate for Discharge   Problem: Clinical Measurements: Goal: Complications related to disease process, condition or treatment will be avoided or minimized Outcome: Adequate for Discharge

## 2023-04-06 NOTE — Plan of Care (Signed)
  Problem: Clinical Measurements: Goal: Ability to maintain clinical measurements within normal limits will improve Outcome: Progressing Goal: Will remain free from infection Outcome: Progressing Goal: Cardiovascular complication will be avoided Outcome: Progressing   Problem: Clinical Measurements: Goal: Will remain free from infection Outcome: Progressing   Problem: Clinical Measurements: Goal: Cardiovascular complication will be avoided Outcome: Progressing   Problem: Elimination: Goal: Will not experience complications related to bowel motility Outcome: Progressing

## 2023-04-21 DIAGNOSIS — Z8759 Personal history of other complications of pregnancy, childbirth and the puerperium: Secondary | ICD-10-CM | POA: Insufficient documentation

## 2023-10-30 DIAGNOSIS — K8020 Calculus of gallbladder without cholecystitis without obstruction: Secondary | ICD-10-CM | POA: Insufficient documentation

## 2023-12-15 ENCOUNTER — Other Ambulatory Visit: Payer: Self-pay | Admitting: Obstetrics and Gynecology

## 2023-12-15 ENCOUNTER — Telehealth: Payer: Self-pay

## 2023-12-15 DIAGNOSIS — Z9889 Other specified postprocedural states: Secondary | ICD-10-CM

## 2023-12-20 DIAGNOSIS — D563 Thalassemia minor: Secondary | ICD-10-CM | POA: Insufficient documentation

## 2024-01-14 ENCOUNTER — Encounter (HOSPITAL_COMMUNITY): Payer: Self-pay | Admitting: Obstetrics and Gynecology

## 2024-01-14 ENCOUNTER — Inpatient Hospital Stay (HOSPITAL_COMMUNITY)
Admission: AD | Admit: 2024-01-14 | Discharge: 2024-01-14 | Disposition: A | Discharge status: Home / Self Care | Attending: Obstetrics & Gynecology | Admitting: Obstetrics & Gynecology

## 2024-01-14 ENCOUNTER — Other Ambulatory Visit: Payer: Self-pay

## 2024-01-14 DIAGNOSIS — Z711 Person with feared health complaint in whom no diagnosis is made: Secondary | ICD-10-CM | POA: Insufficient documentation

## 2024-01-14 DIAGNOSIS — W109XXA Fall (on) (from) unspecified stairs and steps, initial encounter: Secondary | ICD-10-CM | POA: Diagnosis not present

## 2024-01-14 DIAGNOSIS — Z3A20 20 weeks gestation of pregnancy: Secondary | ICD-10-CM | POA: Diagnosis not present

## 2024-01-14 DIAGNOSIS — O9A212 Injury, poisoning and certain other consequences of external causes complicating pregnancy, second trimester: Secondary | ICD-10-CM | POA: Diagnosis present

## 2024-01-14 DIAGNOSIS — M549 Dorsalgia, unspecified: Secondary | ICD-10-CM | POA: Insufficient documentation

## 2024-01-14 DIAGNOSIS — R519 Headache, unspecified: Secondary | ICD-10-CM | POA: Diagnosis present

## 2024-01-14 DIAGNOSIS — W108XXA Fall (on) (from) other stairs and steps, initial encounter: Secondary | ICD-10-CM

## 2024-01-14 NOTE — MAU Note (Signed)
 Diana Higgins is a 28 y.o. at [redacted]w[redacted]d here in MAU reporting: falling last evening @ 1800, unsure of part of body hit ground, HA, light abd cramping and back pain. Denies VB, LOF, strong abd pain.   LMP: na Onset of complaint: 1800 Pain score: 3/10 abd/ back , 5/10 HA Vitals:   01/14/24 1357  BP: 133/73  Pulse: 87  Resp: 16  Temp: 99.3 F (37.4 C)  SpO2: 99%     FHT: 146  Lab orders placed from triage: none

## 2024-01-14 NOTE — MAU Provider Note (Signed)
 History     CSN: [id number]  Arrival date and time: 01/14/24 1329   Event Date/Time   First Provider Initiated Contact with Patient 01/14/24 1412      Chief Complaint  Patient presents with   Fall    1800   Diana Higgins , a  28 y.o. [id number] at [redacted]w[redacted]d presents to MAU with complaints of a fall. Patient reports a fall yesterday at 6pm and states that she walked down the stairs and thought there was 1 additional stair and fell on the right side. Denies hitting her abdomen or her head. She denies vaginal bleeding, leaking of fluid and abdominal pain since the fall. She also reports feeling flutters since the fall. She endorses some soreness but otherwise feels like nothing os wrong. She reports that her OB told her to come and be seen.          OB History     Gravida  4   Para  2   Term  2   Preterm      AB  1   Living  2      SAB      IAB      Ectopic      Multiple  0   Live Births  2           Past Medical History:  Diagnosis Date   Cholestasis during pregnancy    Gallstones    Gestational hypertension w/o significant proteinuria in 3rd trimester 03/27/2022   Heart murmur    since birth per pt   PPH (postpartum hemorrhage) 03/28/2022   Vaginal Pap smear, abnormal     Past Surgical History:  Procedure Laterality Date   CESAREAN SECTION N/A 03/27/2022   Procedure: CESAREAN SECTION;  Surgeon: Storm Setter, DO;  Location: [facility] LD ORS;  Service: Obstetrics;  Laterality: N/A;   CESAREAN SECTION N/A 03/28/2023   Procedure: CESAREAN SECTION;  Surgeon: Gloriann Chick, MD;  Location: [facility] LD ORS;  Service: Obstetrics;  Laterality: N/A;   INDUCED ABORTION      Family History  Problem Relation Age of Onset   Kidney disease Father    Hypertension Father    Breast cancer Maternal Grandmother    Kidney disease Paternal Grandmother    Hypertension Paternal Grandmother     Social History   Tobacco Use   Smoking status: Never   Smokeless tobacco: Never   Vaping Use   Vaping status: Never Used  Substance Use Topics   Alcohol use: Never   Drug use: Never    Allergies:  Allergies  Allergen Reactions   Latex     Pt prefers no latex, her partner is allergic    Medications Prior to Admission  Medication Sig Dispense Refill Last Dose/Taking   ibuprofen  (ADVIL ) 600 MG tablet Take 1 tablet (600 mg total) by mouth every 6 (six) hours as needed for fever, headache or mild pain (pain score 1-3). 30 tablet 1    NIFEdipine  (ADALAT  CC) 30 MG 24 hr tablet Take 2 tablets (60 mg total) by mouth daily. 60 tablet 3     Review of Systems  Constitutional:  Negative for chills, fatigue and fever.  Eyes:  Negative for pain and visual disturbance.  Respiratory:  Negative for apnea, shortness of breath and wheezing.   Cardiovascular:  Negative for chest pain and palpitations.  Gastrointestinal:  Negative for abdominal pain, constipation, diarrhea, nausea and vomiting.  Genitourinary:  Negative for difficulty urinating, dysuria, pelvic pain, vaginal  bleeding, vaginal discharge and vaginal pain.  Musculoskeletal:  Positive for back pain.  Neurological:  Negative for seizures, weakness and headaches.  Psychiatric/Behavioral:  Negative for suicidal ideas.    Physical Exam   Blood pressure 133/73, pulse 87, temperature 99.3 F (37.4 C), temperature source Oral, resp. rate 16, height 5' 4 (1.626 m), weight 111.1 kg, SpO2 99%, currently breastfeeding.  Physical Exam Vitals and nursing note reviewed.  Constitutional:      General: She is not in acute distress.    Appearance: Normal appearance.  HENT:     Head: Normocephalic.  Pulmonary:     Effort: Pulmonary effort is normal.  Abdominal:     Palpations: Abdomen is soft.     Tenderness: There is no abdominal tenderness.  Musculoskeletal:     Cervical back: Normal range of motion.  Skin:    General: Skin is warm and dry.  Neurological:     Mental Status: She is alert and oriented to person,  place, and time.  Psychiatric:        Mood and Affect: Mood normal.    FHT obtained in triage .  MAU Course  Procedures Orders Placed This Encounter  Procedures   Discharge patient Discharge disposition: 01-Home or Self Care; Discharge patient date: 01/14/2024   MDM - FHT obtained and patient feeling flutters since fall >12 hours ago.  - Dr. Magali called to bedside to scan placenta given <[redacted] weeks gestation. Reviewed that abruption may not be seen on US  with patient. Reassured by active fetus with cardiac activity on US .  - plan for discharge.   Assessment and Plan   1. Physically well but worried   2. [redacted] weeks gestation of pregnancy   3. Fall (on) (from) other stairs and steps, initial encounter    - Reviewed reassuring signs following a fall.  - Discussed worsening signs and return precautions. - Patient discharged home in stable condition and may return to MAU as needed.    Claris CHRISTELLA Cedar, MSN CNM  01/14/2024, 2:12 PM

## 2024-01-26 DIAGNOSIS — O34219 Maternal care for unspecified type scar from previous cesarean delivery: Secondary | ICD-10-CM | POA: Insufficient documentation

## 2024-01-26 DIAGNOSIS — Z8759 Personal history of other complications of pregnancy, childbirth and the puerperium: Secondary | ICD-10-CM | POA: Insufficient documentation

## 2024-02-03 ENCOUNTER — Ambulatory Visit

## 2024-02-03 ENCOUNTER — Other Ambulatory Visit: Payer: Self-pay | Admitting: *Deleted

## 2024-02-03 ENCOUNTER — Ambulatory Visit: Attending: Obstetrics

## 2024-02-03 VITALS — BP 145/74 | HR 92

## 2024-02-03 DIAGNOSIS — O34219 Maternal care for unspecified type scar from previous cesarean delivery: Secondary | ICD-10-CM

## 2024-02-03 DIAGNOSIS — O09292 Supervision of pregnancy with other poor reproductive or obstetric history, second trimester: Secondary | ICD-10-CM | POA: Insufficient documentation

## 2024-02-03 DIAGNOSIS — Z3A23 23 weeks gestation of pregnancy: Secondary | ICD-10-CM | POA: Diagnosis not present

## 2024-02-03 DIAGNOSIS — Z8759 Personal history of other complications of pregnancy, childbirth and the puerperium: Secondary | ICD-10-CM | POA: Diagnosis not present

## 2024-02-03 DIAGNOSIS — O99210 Obesity complicating pregnancy, unspecified trimester: Secondary | ICD-10-CM

## 2024-02-03 DIAGNOSIS — Z9889 Other specified postprocedural states: Secondary | ICD-10-CM

## 2024-02-03 DIAGNOSIS — O99212 Obesity complicating pregnancy, second trimester: Secondary | ICD-10-CM | POA: Diagnosis not present

## 2024-02-03 DIAGNOSIS — Z8719 Personal history of other diseases of the digestive system: Secondary | ICD-10-CM | POA: Diagnosis not present

## 2024-02-03 DIAGNOSIS — O132 Gestational [pregnancy-induced] hypertension without significant proteinuria, second trimester: Secondary | ICD-10-CM

## 2024-02-03 NOTE — Progress Notes (Signed)
 MFM Consult Note  Diana Higgins is currently at [redacted]w[redacted]d. She was seen today for a detailed fetal anatomy scan due to maternal obesity with a BMI of 37.   Her prior pregnancies were complicated by severe preeclampsia and cholestasis of pregnancy..  She denies any problems in her current pregnancy.    She had 2 cell free DNA test drawn earlier in her pregnancy which showed no result due to insufficient fetal DNA.  Sonographic findings Single intrauterine pregnancy at 23w 2d. Fetal cardiac activity:  Observed and appears normal. Presentation: Variable. The anatomic structures that were well seen appear normal. The anatomic survey is complete.  Fetal biometry shows the estimated fetal weight of 1 lb 8 oz,  674 grams (85%). Amniotic fluid: Within normal limits.  MVP: 5.81 cm. Placenta: Posterior Fundal. Adnexa: No abnormality visualized. Cervical length: 4.2 cm.  The patient was informed that anomalies may be missed due to technical limitations. If the fetus is in a suboptimal position or maternal habitus is increased, visualization of the fetus in the maternal uterus may be impaired.  The patient reports that her [facility] of May 30, 2024 may be inaccurate.  She had a first trimester ultrasound performed in your office earlier in her pregnancy.  She will discuss what her most accurate due date is with you during her prenatal visit later today.  Due to maternal obesity and her past pregnancy history, we will continue to follow her with growth ultrasounds throughout her pregnancy.    Weekly fetal testing should be started at 34 weeks and continued until delivery.  She was offered and declined the opportunity for a redraw of her cell free DNA test.    A follow-up growth scan was scheduled in 4 weeks.  The patient stated that all of her questions were answered.   A total of 25 minutes was spent counseling and coordinating the care for this patient.  Greater than 50% of the time was spent in direct  face-to-face contact.

## 2024-03-08 ENCOUNTER — Other Ambulatory Visit: Payer: Self-pay | Admitting: Obstetrics

## 2024-03-08 ENCOUNTER — Ambulatory Visit: Attending: Obstetrics | Admitting: Maternal & Fetal Medicine

## 2024-03-08 ENCOUNTER — Ambulatory Visit

## 2024-03-08 DIAGNOSIS — O26613 Liver and biliary tract disorders in pregnancy, third trimester: Secondary | ICD-10-CM | POA: Diagnosis not present

## 2024-03-08 DIAGNOSIS — O34219 Maternal care for unspecified type scar from previous cesarean delivery: Secondary | ICD-10-CM | POA: Diagnosis not present

## 2024-03-08 DIAGNOSIS — O09293 Supervision of pregnancy with other poor reproductive or obstetric history, third trimester: Secondary | ICD-10-CM | POA: Insufficient documentation

## 2024-03-08 DIAGNOSIS — Z8719 Personal history of other diseases of the digestive system: Secondary | ICD-10-CM

## 2024-03-08 DIAGNOSIS — Z3A29 29 weeks gestation of pregnancy: Secondary | ICD-10-CM | POA: Insufficient documentation

## 2024-03-08 DIAGNOSIS — O26643 Intrahepatic cholestasis of pregnancy, third trimester: Secondary | ICD-10-CM | POA: Insufficient documentation

## 2024-03-08 DIAGNOSIS — Z148 Genetic carrier of other disease: Secondary | ICD-10-CM | POA: Diagnosis not present

## 2024-03-08 DIAGNOSIS — O99213 Obesity complicating pregnancy, third trimester: Secondary | ICD-10-CM | POA: Insufficient documentation

## 2024-03-08 DIAGNOSIS — O99210 Obesity complicating pregnancy, unspecified trimester: Secondary | ICD-10-CM

## 2024-03-08 DIAGNOSIS — E669 Obesity, unspecified: Secondary | ICD-10-CM

## 2024-03-08 DIAGNOSIS — O132 Gestational [pregnancy-induced] hypertension without significant proteinuria, second trimester: Secondary | ICD-10-CM | POA: Insufficient documentation

## 2024-03-08 DIAGNOSIS — Z8759 Personal history of other complications of pregnancy, childbirth and the puerperium: Secondary | ICD-10-CM | POA: Diagnosis not present

## 2024-03-08 DIAGNOSIS — Z3A28 28 weeks gestation of pregnancy: Secondary | ICD-10-CM | POA: Diagnosis not present

## 2024-03-08 DIAGNOSIS — K831 Obstruction of bile duct: Secondary | ICD-10-CM | POA: Diagnosis not present

## 2024-03-08 NOTE — Progress Notes (Signed)
 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 H5E7987 at [redacted]w[redacted]d here for ultrasound and consultation. Tykiera Govoni is doing well today with no acute concerns. Today we focused on the following:   The patient was referred back for management of intrahepatic cholestasis of pregnancy. There was discussion regarding dating of the pregnancy. She was previously dated by an early ultrasound performed at 9 weeks and 3 days, establishing an estimated due date of 06/19/24. The patient has a known last menstrual period with regular 27-28-day cycles the months prior to conception. Because the discrepancy between the estimated due date based on last menstrual period and the early ultrasound is less than 7 days difference, the estimated due date should be assigned based on the last menstrual period in accordance with American College of Obstetricians and Gynecologists dating guidelines. A change in estimated due date would require a discrepancy greater than 7 days when the ultrasound is performed between 9 weeks 0 days and 13 weeks 6 days gestation.  The patient reports a history of cholestasis in each prior pregnancy, with diagnosis occurring later than typical in this pregnancy. She notes minimal pruritus since starting ursodiol and reports normal fetal movement. She expressed a preference to continue the pregnancy to [redacted] weeks gestation if possible. I discussed that  this is a reasonable plan provided her symptoms remain tolerable, bile acid levels remain below 40 mol/L, and fetal surveillance remains reassuring. We reviewed the clinical significant of ICP in pregnancy and the association with stillbirth and need for monitoring and possibly early delivery.   We discussed the importance of antenatal fetal testing. She should begin weekly nonstress testing within the next 1-2 weeks at the discretion of her OB provider. She will also continue monthly growth ultrasounds with biophysical profile assessment.  RECOMMENDATIONS -Use estimated due date based on last menstrual period per ACOG dating criteria, giving her the EDD of 05/24/24 -Continue ursodiol therapy for cholestasis management -Monitor bile acid levels with goal <40 mol/L - these must be performed at least monthly with a CMP -Initiate weekly antenatal testing between 28-30 weeks at the [facility] providers discretion at the [facility] providers office -Perform monthly growth ultrasounds with biophysical profile - we will continue to follow the patient monthly  -Plan for delivery at [redacted] weeks gestation if bile acids remain < 40, maternal symptoms remain tolerable and fetal testing remains reassuring  The patient had time to ask questions that were answered to her satisfaction.  She verbalized understanding and agrees to proceed with the plan above.   I spent 40 minutes reviewing the patients chart, including labs and images as well as counseling the patient about her medical conditions. Greater than 50% of the time was spent in direct face-to-face patient counseling.  Delora Smaller  MFM, [place]   03/08/2024  11:20 AM   Review of Systems: A review of systems was performed and was negative except per HPI   Vitals and Physical Exam  03/08/2024   10:33 AM 02/03/2024    9:59 AM 01/14/2024    1:57 PM  Vitals with BMI  Height   5' 4  Weight   245 lbs  BMI   42.03  Systolic 131 145 866  Diastolic 75 74 73   Pulse 88 92 87    Sitting comfortably on the sonogram table Nonlabored breathing Normal rate and rhythm Abdomen is nontender  Past pregnancies OB History  Gravida Para Term Preterm AB Living  4 2 2  1 2   SAB IAB Ectopic Multiple Live Births     0 2    # Outcome Date GA Lbr Len/2nd Weight Sex Type Anes PTL Lv  4 Current           3 Term 03/28/23 [redacted]w[redacted]d  8 lb 10.3 oz (3.92 kg) M CS-Vac Spinal  LIV  2 Term 03/27/22 [redacted]w[redacted]d  6 lb 15.8 oz (3.17 kg) M CS-LTranv EPI  LIV  1 AB 2015             Future Appointments  Date Time Provider Department Center  04/12/2024 10:15 AM [facility] PROVIDER 1 [facility]  04/12/2024 10:30 AM [facility] US3 [facility]      "

## 2024-04-12 ENCOUNTER — Ambulatory Visit
